# Patient Record
Sex: Female | Born: 1985 | Race: White | Hispanic: No | Marital: Married | Smoking: Never smoker
Health system: Southern US, Community
[De-identification: ages and names within clinical notes are randomized; demographics above are authoritative.]

## PROBLEM LIST (undated history)

## (undated) DIAGNOSIS — Z8619 Personal history of other infectious and parasitic diseases: Secondary | ICD-10-CM

## (undated) DIAGNOSIS — K219 Gastro-esophageal reflux disease without esophagitis: Secondary | ICD-10-CM

## (undated) HISTORY — PX: PLACEMENT OF BREAST IMPLANTS: SHX6334

## (undated) HISTORY — DX: Personal history of other infectious and parasitic diseases: Z86.19

---
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
## 2016-01-23 HISTORY — PX: TUBAL LIGATION: SHX77

## 2016-07-26 LAB — HM PAP SMEAR

## 2016-09-18 DIAGNOSIS — Z3400 Encounter for supervision of normal first pregnancy, unspecified trimester: Secondary | ICD-10-CM | POA: Diagnosis not present

## 2016-09-18 DIAGNOSIS — Z363 Encounter for antenatal screening for malformations: Secondary | ICD-10-CM | POA: Diagnosis not present

## 2016-09-18 DIAGNOSIS — Z3A17 17 weeks gestation of pregnancy: Secondary | ICD-10-CM | POA: Diagnosis not present

## 2016-09-18 DIAGNOSIS — Z3480 Encounter for supervision of other normal pregnancy, unspecified trimester: Secondary | ICD-10-CM | POA: Diagnosis not present

## 2016-10-10 DIAGNOSIS — Z3A21 21 weeks gestation of pregnancy: Secondary | ICD-10-CM | POA: Diagnosis not present

## 2016-10-16 DIAGNOSIS — Z3A21 21 weeks gestation of pregnancy: Secondary | ICD-10-CM | POA: Diagnosis not present

## 2016-10-16 DIAGNOSIS — O09212 Supervision of pregnancy with history of pre-term labor, second trimester: Secondary | ICD-10-CM | POA: Diagnosis not present

## 2016-10-16 DIAGNOSIS — Z3492 Encounter for supervision of normal pregnancy, unspecified, second trimester: Secondary | ICD-10-CM | POA: Diagnosis not present

## 2016-11-22 DIAGNOSIS — Z3A27 27 weeks gestation of pregnancy: Secondary | ICD-10-CM | POA: Diagnosis not present

## 2016-11-22 DIAGNOSIS — O360920 Maternal care for other rhesus isoimmunization, second trimester, not applicable or unspecified: Secondary | ICD-10-CM | POA: Diagnosis not present

## 2016-11-22 DIAGNOSIS — Z3492 Encounter for supervision of normal pregnancy, unspecified, second trimester: Secondary | ICD-10-CM | POA: Diagnosis not present

## 2016-11-22 DIAGNOSIS — Z23 Encounter for immunization: Secondary | ICD-10-CM | POA: Diagnosis not present

## 2016-11-22 DIAGNOSIS — Z3480 Encounter for supervision of other normal pregnancy, unspecified trimester: Secondary | ICD-10-CM | POA: Diagnosis not present

## 2016-12-04 DIAGNOSIS — Z3400 Encounter for supervision of normal first pregnancy, unspecified trimester: Secondary | ICD-10-CM | POA: Diagnosis not present

## 2016-12-04 DIAGNOSIS — D509 Iron deficiency anemia, unspecified: Secondary | ICD-10-CM | POA: Diagnosis not present

## 2016-12-11 DIAGNOSIS — Z3400 Encounter for supervision of normal first pregnancy, unspecified trimester: Secondary | ICD-10-CM | POA: Diagnosis not present

## 2016-12-11 DIAGNOSIS — Z3480 Encounter for supervision of other normal pregnancy, unspecified trimester: Secondary | ICD-10-CM | POA: Diagnosis not present

## 2016-12-17 DIAGNOSIS — O26873 Cervical shortening, third trimester: Secondary | ICD-10-CM | POA: Diagnosis not present

## 2016-12-17 DIAGNOSIS — Z3A30 30 weeks gestation of pregnancy: Secondary | ICD-10-CM | POA: Diagnosis not present

## 2016-12-17 DIAGNOSIS — Z3400 Encounter for supervision of normal first pregnancy, unspecified trimester: Secondary | ICD-10-CM | POA: Diagnosis not present

## 2016-12-28 ENCOUNTER — Inpatient Hospital Stay (HOSPITAL_COMMUNITY)
Admission: AD | Admit: 2016-12-28 | Discharge: 2017-01-01 | DRG: 783 | Disposition: A | Discharge status: Home / Self Care | Payer: 59 | Source: Ambulatory Visit | Attending: Obstetrics and Gynecology | Admitting: Obstetrics and Gynecology

## 2016-12-28 ENCOUNTER — Encounter (HOSPITAL_COMMUNITY): Payer: Self-pay | Admitting: *Deleted

## 2016-12-28 ENCOUNTER — Other Ambulatory Visit: Payer: Self-pay

## 2016-12-28 DIAGNOSIS — Z9882 Breast implant status: Secondary | ICD-10-CM

## 2016-12-28 DIAGNOSIS — K219 Gastro-esophageal reflux disease without esophagitis: Secondary | ICD-10-CM | POA: Diagnosis present

## 2016-12-28 DIAGNOSIS — O9962 Diseases of the digestive system complicating childbirth: Secondary | ICD-10-CM | POA: Diagnosis present

## 2016-12-28 DIAGNOSIS — Z302 Encounter for sterilization: Secondary | ICD-10-CM

## 2016-12-28 DIAGNOSIS — O34219 Maternal care for unspecified type scar from previous cesarean delivery: Secondary | ICD-10-CM | POA: Diagnosis not present

## 2016-12-28 DIAGNOSIS — O34211 Maternal care for low transverse scar from previous cesarean delivery: Principal | ICD-10-CM | POA: Diagnosis present

## 2016-12-28 DIAGNOSIS — Z3A32 32 weeks gestation of pregnancy: Secondary | ICD-10-CM | POA: Diagnosis not present

## 2016-12-28 DIAGNOSIS — O4703 False labor before 37 completed weeks of gestation, third trimester: Secondary | ICD-10-CM

## 2016-12-28 HISTORY — DX: Gastro-esophageal reflux disease without esophagitis: K21.9

## 2016-12-28 LAB — COMPREHENSIVE METABOLIC PANEL
ALBUMIN: 3.1 g/dL — AB (ref 3.5–5.0)
ALK PHOS: 146 U/L — AB (ref 38–126)
ALT: 10 U/L — AB (ref 14–54)
ANION GAP: 10 (ref 5–15)
AST: 22 U/L (ref 15–41)
BUN: 12 mg/dL (ref 6–20)
CALCIUM: 8.6 mg/dL — AB (ref 8.9–10.3)
CO2: 19 mmol/L — AB (ref 22–32)
Chloride: 105 mmol/L (ref 101–111)
Creatinine, Ser: 0.66 mg/dL (ref 0.44–1.00)
GFR calc Af Amer: >60 mL/min (ref >60)
GFR calc non Af Amer: >60 mL/min (ref >60)
GLUCOSE: 108 mg/dL — AB (ref 65–99)
Potassium: 4.1 mmol/L (ref 3.5–5.1)
SODIUM: 134 mmol/L — AB (ref 135–145)
Total Bilirubin: 0.5 mg/dL (ref 0.3–1.2)
Total Protein: 6.3 g/dL — ABNORMAL LOW (ref 6.5–8.1)

## 2016-12-28 LAB — CBC
HCT: 32.6 % — ABNORMAL LOW (ref 36.0–46.0)
HEMOGLOBIN: 10.6 g/dL — AB (ref 12.0–15.0)
MCH: 29.5 pg (ref 26.0–34.0)
MCHC: 32.5 g/dL (ref 30.0–36.0)
MCV: 90.8 fL (ref 78.0–100.0)
Platelets: 236 10*3/uL (ref 150–400)
RBC: 3.59 MIL/uL — ABNORMAL LOW (ref 3.87–5.11)
RDW: 12.2 % (ref 11.5–15.5)
WBC: 9.7 10*3/uL (ref 4.0–10.5)

## 2016-12-28 LAB — URINALYSIS, ROUTINE W REFLEX MICROSCOPIC
Bilirubin Urine: NEGATIVE
Glucose, UA: NEGATIVE mg/dL
Hgb urine dipstick: NEGATIVE
Ketones, ur: NEGATIVE mg/dL
Nitrite: NEGATIVE
PROTEIN: NEGATIVE mg/dL
SPECIFIC GRAVITY, URINE: 1.016 (ref 1.005–1.030)
pH: 6 (ref 5.0–8.0)

## 2016-12-28 LAB — WET PREP, GENITAL
Sperm: NONE SEEN
Trich, Wet Prep: NONE SEEN
YEAST WET PREP: NONE SEEN

## 2016-12-28 MED ORDER — LACTATED RINGERS IV SOLN
INTRAVENOUS | Status: DC
  Administered 2016-12-28 – 2016-12-29 (×2): via INTRAVENOUS

## 2016-12-28 MED ORDER — CALCIUM CARBONATE ANTACID 500 MG PO CHEW
2.0000 | CHEWABLE_TABLET | ORAL | Status: DC | PRN
Start: 2016-12-28 — End: 2016-12-29

## 2016-12-28 MED ORDER — MAGNESIUM SULFATE BOLUS VIA INFUSION
4.0000 g | Freq: Once | INTRAVENOUS | Status: AC
  Administered 2016-12-28: 4 g via INTRAVENOUS
  Filled 2016-12-28: qty 500

## 2016-12-28 MED ORDER — BETAMETHASONE SOD PHOS & ACET 6 (3-3) MG/ML IJ SUSP
12.0000 mg | INTRAMUSCULAR | Status: DC
  Administered 2016-12-28: 12 mg via INTRAMUSCULAR
  Filled 2016-12-28: qty 2

## 2016-12-28 MED ORDER — DOCUSATE SODIUM 100 MG PO CAPS
100.0000 mg | ORAL_CAPSULE | Freq: Every day | ORAL | Status: DC
  Administered 2016-12-28: 100 mg via ORAL
  Filled 2016-12-28: qty 1

## 2016-12-28 MED ORDER — ACETAMINOPHEN 325 MG PO TABS: 650.0000 mg | ORAL_TABLET | ORAL | Status: DC | PRN

## 2016-12-28 MED ORDER — PRENATAL MULTIVITAMIN CH: 1.0000 | ORAL_TABLET | Freq: Every day | ORAL | Status: DC

## 2016-12-28 MED ORDER — LACTATED RINGERS IV BOLUS (SEPSIS)
1000.0000 mL | Freq: Once | INTRAVENOUS | Status: AC
  Administered 2016-12-28: 1000 mL via INTRAVENOUS

## 2016-12-28 MED ORDER — ZOLPIDEM TARTRATE 5 MG PO TABS: 5.0000 mg | ORAL_TABLET | Freq: Every evening | ORAL | Status: DC | PRN

## 2016-12-28 MED ORDER — MAGNESIUM SULFATE 40 G IN LACTATED RINGERS - SIMPLE
3.0000 g/h | INTRAVENOUS | Status: DC
  Administered 2016-12-28: 2 g/h via INTRAVENOUS
  Filled 2016-12-28: qty 40

## 2016-12-28 NOTE — MAU Note (Signed)
Pt presents with c/o lower abdominal cramping & pressure since 1300 this afternnoon.  Denies VB or LOF, reports increased vaginal discharge, no odor.  Reports +FM. Reports 2 previous PT deliveries. Previous C/S x1, planning repeat.

## 2016-12-28 NOTE — MAU Provider Note (Signed)
History     CSN: [id number]  Arrival date and time: 12/28/16 1523   First Provider Initiated Contact with Patient 12/28/16 1554      Chief Complaint  Patient presents with  . Contractions   [id number] @32 .2 wks here with lower abdominal cramping and pelvic pressure. Sx started about 3 hrs ago. She denies VB or LOF. She reports increased yellow vaginal discharge today. Good FM. No urinanry sx. Her pregnancy is complicated by previous PTB x2 and CS x1. She is planning RCS and BTL. She was using 17-P injections but stopped due to cost.     OB History    Gravida Para Term Preterm AB Living   4 2   2 1 2    SAB TAB Ectopic Multiple Live Births   1       2      Past Medical History:  Diagnosis Date  . GERD (gastroesophageal reflux disease)     Past Surgical History:  Procedure Laterality Date  . CESAREAN SECTION    . PLACEMENT OF BREAST IMPLANTS      No family history on file.  Social History   Tobacco Use  . Smoking status: Never Smoker  . Smokeless tobacco: Never Used  Substance Use Topics  . Alcohol use: No    Frequency: Never    Comment: not during pregnancy  . Drug use: No    Allergies: Allergies not on file  No medications prior to admission.    Review of Systems  Gastrointestinal: Positive for abdominal pain.  Genitourinary: Positive for pelvic pain and vaginal discharge. Negative for vaginal bleeding.   Physical Exam   Blood pressure 126/85, pulse 94, temperature 98.1 F (36.7 C), temperature source Oral, height 5\' 3"  (1.6 m), weight 141 lb 8 oz (64.2 kg), SpO2 98 %.  Physical Exam  Constitutional: She is oriented to person, place, and time. She appears well-developed and well-nourished. No distress.  HENT:  Head: Normocephalic and atraumatic.  Neck: Normal range of motion.  Respiratory: Effort normal. No respiratory distress.  GI: Soft. She exhibits no distension. There is no tenderness.  gravid  Genitourinary:  Genitourinary Comments: SVE  1.5/60/-2, vtx ballotable  Musculoskeletal: Normal range of motion.  Neurological: She is alert and oriented to person, place, and time.  Skin: Skin is warm and dry.  Psychiatric: She has a normal mood and affect.  EFM: 140 bpm, mod variability, + accels, no decels Toco: 3-5 w/irritability  Results for orders placed or performed during the hospital encounter of 12/28/16 (from the past 24 hour(s))  Urinalysis, Routine w reflex microscopic     Status: Abnormal   Collection Time: 12/28/16  3:40 PM  Result Value Ref Range   Color, Urine YELLOW YELLOW   APPearance HAZY (A) CLEAR   Specific Gravity, Urine 1.016 1.005 - 1.030   pH 6.0 5.0 - 8.0   Glucose, UA NEGATIVE NEGATIVE mg/dL   Hgb urine dipstick NEGATIVE NEGATIVE   Bilirubin Urine NEGATIVE NEGATIVE   Ketones, ur NEGATIVE NEGATIVE mg/dL   Protein, ur NEGATIVE NEGATIVE mg/dL   Nitrite NEGATIVE NEGATIVE   Leukocytes, UA LARGE (A) NEGATIVE   RBC / HPF 0-5 0 - 5 RBC/hpf   WBC, UA 6-30 0 - 5 WBC/hpf   Bacteria, UA FEW (A) NONE SEEN   Squamous Epithelial / LPF 6-30 (A) NONE SEEN   Mucus PRESENT    MAU Course  Procedures Magnesium Sulfate 4/2 Betamethasone LR 1 L  MDM Labs ordered and reviewed. Dr.  Tomblin notified of pt presentation and clinical findings. Will start Mg and admit.  Assessment and Plan  32.2 weeks Threatened PTL Admit Management per MD  Julianne Handler, CNM 12/28/2016, 4:14 PM

## 2016-12-28 NOTE — H&P (Signed)
Alyssa [name] is an 31 y.o. female with contractions getting progressively heavier through the day. No ROM, no bleeding, no fever/chills, no N/V. History of two premature deliveries and desires repeat cesarean section with BTL for this delivery.  Pertinent Gynecological History: Menses: pregnant Bleeding: N/A Contraception: none DES exposure: denies Blood transfusions: none Sexually transmitted diseases: no past history Previous GYN Procedures: none  Last mammogram: N/A Date: N/A Last pap: normal Date: 2018 OB History: G4, P2   Menstrual History: Menarche age: unknown No LMP recorded. Patient is pregnant.    Past Medical History:  Diagnosis Date  . GERD (gastroesophageal reflux disease)     Past Surgical History:  Procedure Laterality Date  . CESAREAN SECTION    . PLACEMENT OF BREAST IMPLANTS      No family history on file.  Social History:  reports that  has never smoked. she has never used smokeless tobacco. She reports that she does not drink alcohol or use drugs.  Allergies: No Known Allergies  Medications Prior to Admission  Medication Sig Dispense Refill Last Dose  . calcium carbonate (TUMS - DOSED IN MG ELEMENTAL CALCIUM) 500 MG chewable tablet Chew 1-2 tablets by mouth daily as needed for indigestion or heartburn.   12/27/2016 at Unknown time  . IRON PO Take 1 tablet by mouth at bedtime.   12/27/2016 at Unknown time  . Prenatal Vit-Fe Fumarate-FA (PRENATAL MULTIVITAMIN) TABS tablet Take 1 tablet by mouth daily at 12 noon.   12/27/2016 at Unknown time    Review of Systems  Constitutional: Negative for fever.  Eyes: Negative for blurred vision.  Neurological: Negative for headaches.    Blood pressure 124/63, pulse 84, temperature 98.1 F (36.7 C), temperature source Oral, resp. rate 18, height 5\' 3"  (1.6 m), weight 141 lb 8 oz (64.2 kg), SpO2 98 %. Physical Exam  Cardiovascular: Normal rate and regular rhythm.  Respiratory: Effort normal and breath sounds normal.   GI: Soft. There is no tenderness.  Neurological: She has normal reflexes.   FHT cat one UCs were q2-5, now spacing out Cx 1.5/60/vtx per nurse check  Results for orders placed or performed during the hospital encounter of 12/28/16 (from the past 24 hour(s))  Urinalysis, Routine w reflex microscopic     Status: Abnormal   Collection Time: 12/28/16  3:40 PM  Result Value Ref Range   Color, Urine YELLOW YELLOW   APPearance HAZY (A) CLEAR   Specific Gravity, Urine 1.016 1.005 - 1.030   pH 6.0 5.0 - 8.0   Glucose, UA NEGATIVE NEGATIVE mg/dL   Hgb urine dipstick NEGATIVE NEGATIVE   Bilirubin Urine NEGATIVE NEGATIVE   Ketones, ur NEGATIVE NEGATIVE mg/dL   Protein, ur NEGATIVE NEGATIVE mg/dL   Nitrite NEGATIVE NEGATIVE   Leukocytes, UA LARGE (A) NEGATIVE   RBC / HPF 0-5 0 - 5 RBC/hpf   WBC, UA 6-30 0 - 5 WBC/hpf   Bacteria, UA FEW (A) NONE SEEN   Squamous Epithelial / LPF 6-30 (A) NONE SEEN   Mucus PRESENT   Wet prep, genital     Status: Abnormal   Collection Time: 12/28/16  4:15 PM  Result Value Ref Range   Yeast Wet Prep HPF POC NONE SEEN NONE SEEN   Trich, Wet Prep NONE SEEN NONE SEEN   Clue Cells Wet Prep HPF POC PRESENT (A) NONE SEEN   WBC, Wet Prep HPF POC MANY (A) NONE SEEN   Sperm NONE SEEN     No results found.  Assessment/Plan:  31 yo G4P2 @ 32 2/7 weeks in PTL IV fluids Magnesium sulfate for tocolysis and CP prophylaxis Betamethasone ordered U/S in am C/S with BTL for delivery D/W patient and husband  Shon Millet II 12/28/2016, 4:59 PM

## 2016-12-29 ENCOUNTER — Encounter (HOSPITAL_COMMUNITY): Payer: Self-pay | Admitting: Anesthesiology

## 2016-12-29 ENCOUNTER — Inpatient Hospital Stay (HOSPITAL_COMMUNITY): Payer: 59 | Admitting: Anesthesiology

## 2016-12-29 ENCOUNTER — Encounter (HOSPITAL_COMMUNITY)
Admission: AD | Disposition: A | Discharge status: Home / Self Care | Payer: Self-pay | Source: Ambulatory Visit | Attending: Obstetrics and Gynecology

## 2016-12-29 LAB — CBC
HCT: 29.4 % — ABNORMAL LOW (ref 36.0–46.0)
Hemoglobin: 9.9 g/dL — ABNORMAL LOW (ref 12.0–15.0)
MCH: 30.6 pg (ref 26.0–34.0)
MCHC: 33.7 g/dL (ref 30.0–36.0)
MCV: 90.7 fL (ref 78.0–100.0)
PLATELETS: 190 10*3/uL (ref 150–400)
RBC: 3.24 MIL/uL — ABNORMAL LOW (ref 3.87–5.11)
RDW: 12.2 % (ref 11.5–15.5)
WBC: 17.5 10*3/uL — AB (ref 4.0–10.5)

## 2016-12-29 SURGERY — Surgical Case
Anesthesia: Spinal

## 2016-12-29 MED ORDER — DIPHENHYDRAMINE HCL 25 MG PO CAPS: 25.0000 mg | ORAL_CAPSULE | ORAL | Status: DC | PRN

## 2016-12-29 MED ORDER — NALBUPHINE HCL 10 MG/ML IJ SOLN: 5.0000 mg | INTRAMUSCULAR | Status: DC | PRN

## 2016-12-29 MED ORDER — SODIUM CHLORIDE 0.9% FLUSH
INTRAVENOUS | Status: AC
  Filled 2016-12-29: qty 9

## 2016-12-29 MED ORDER — WITCH HAZEL-GLYCERIN EX PADS: 1.0000 "application " | MEDICATED_PAD | CUTANEOUS | Status: DC | PRN

## 2016-12-29 MED ORDER — SODIUM CHLORIDE 0.9% FLUSH
INTRAVENOUS | Status: AC
  Filled 2016-12-29: qty 3

## 2016-12-29 MED ORDER — OXYTOCIN 10 UNIT/ML IJ SOLN
INTRAVENOUS | Status: DC | PRN
  Administered 2016-12-29: 40 [IU] via INTRAVENOUS

## 2016-12-29 MED ORDER — KETOROLAC TROMETHAMINE 30 MG/ML IJ SOLN
30.0000 mg | Freq: Four times a day (QID) | INTRAMUSCULAR | Status: AC | PRN
  Administered 2016-12-29: 30 mg via INTRAMUSCULAR

## 2016-12-29 MED ORDER — MEPERIDINE HCL 25 MG/ML IJ SOLN: 6.2500 mg | INTRAMUSCULAR | Status: DC | PRN

## 2016-12-29 MED ORDER — PROMETHAZINE HCL 25 MG/ML IJ SOLN
INTRAMUSCULAR | Status: AC
  Filled 2016-12-29: qty 1

## 2016-12-29 MED ORDER — DIBUCAINE 1 % RE OINT: 1.0000 "application " | TOPICAL_OINTMENT | RECTAL | Status: DC | PRN

## 2016-12-29 MED ORDER — LACTATED RINGERS IV SOLN: INTRAVENOUS | Status: DC

## 2016-12-29 MED ORDER — NALOXONE HCL 0.4 MG/ML IJ SOLN: 0.4000 mg | INTRAMUSCULAR | Status: DC | PRN

## 2016-12-29 MED ORDER — ACETAMINOPHEN 500 MG PO TABS
ORAL_TABLET | ORAL | Status: AC
  Filled 2016-12-29: qty 2

## 2016-12-29 MED ORDER — SCOPOLAMINE 1 MG/3DAYS TD PT72
MEDICATED_PATCH | TRANSDERMAL | Status: DC | PRN
  Administered 2016-12-29: 1 via TRANSDERMAL

## 2016-12-29 MED ORDER — LACTATED RINGERS IV SOLN
INTRAVENOUS | Status: DC | PRN
  Administered 2016-12-29: 03:00:00 via INTRAVENOUS

## 2016-12-29 MED ORDER — COCONUT OIL OIL
1.0000 "application " | TOPICAL_OIL | Status: DC | PRN
  Administered 2016-12-29: 1 via TOPICAL
  Filled 2016-12-29: qty 120

## 2016-12-29 MED ORDER — ZOLPIDEM TARTRATE 5 MG PO TABS: 5.0000 mg | ORAL_TABLET | Freq: Every evening | ORAL | Status: DC | PRN

## 2016-12-29 MED ORDER — FENTANYL CITRATE (PF) 100 MCG/2ML IJ SOLN
INTRAMUSCULAR | Status: DC | PRN
  Administered 2016-12-29: 20 ug via INTRATHECAL

## 2016-12-29 MED ORDER — BUPIVACAINE IN DEXTROSE 0.75-8.25 % IT SOLN
INTRATHECAL | Status: DC | PRN
  Administered 2016-12-29: 1.2 mL via INTRATHECAL

## 2016-12-29 MED ORDER — SODIUM CHLORIDE 0.9 % IR SOLN
Status: DC | PRN
Start: 2016-12-29 — End: 2016-12-29
  Administered 2016-12-29: 500 mL

## 2016-12-29 MED ORDER — DIPHENHYDRAMINE HCL 25 MG PO CAPS: 25.0000 mg | ORAL_CAPSULE | Freq: Four times a day (QID) | ORAL | Status: DC | PRN

## 2016-12-29 MED ORDER — KETOROLAC TROMETHAMINE 30 MG/ML IJ SOLN: 30.0000 mg | Freq: Once | INTRAMUSCULAR | Status: DC | PRN

## 2016-12-29 MED ORDER — SIMETHICONE 80 MG PO CHEW
80.0000 mg | CHEWABLE_TABLET | Freq: Three times a day (TID) | ORAL | Status: DC
  Administered 2016-12-29 – 2017-01-01 (×9): 80 mg via ORAL
  Filled 2016-12-29 (×9): qty 1

## 2016-12-29 MED ORDER — DEXAMETHASONE SODIUM PHOSPHATE 10 MG/ML IJ SOLN
INTRAMUSCULAR | Status: DC | PRN
  Administered 2016-12-29: 10 mg via INTRAVENOUS

## 2016-12-29 MED ORDER — OXYCODONE HCL 5 MG PO TABS
10.0000 mg | ORAL_TABLET | ORAL | Status: DC | PRN
  Administered 2017-01-01: 10 mg via ORAL
  Filled 2016-12-29 (×2): qty 2

## 2016-12-29 MED ORDER — OXYTOCIN 40 UNITS IN LACTATED RINGERS INFUSION - SIMPLE MED: 2.5000 [IU]/h | INTRAVENOUS | Status: AC

## 2016-12-29 MED ORDER — BUPIVACAINE IN DEXTROSE 0.75-8.25 % IT SOLN
INTRATHECAL | Status: AC
  Filled 2016-12-29: qty 2

## 2016-12-29 MED ORDER — ONDANSETRON HCL 4 MG/2ML IJ SOLN
INTRAMUSCULAR | Status: DC | PRN
  Administered 2016-12-29: 4 mg via INTRAVENOUS

## 2016-12-29 MED ORDER — PROMETHAZINE HCL 25 MG/ML IJ SOLN
6.2500 mg | INTRAMUSCULAR | Status: DC | PRN
  Administered 2016-12-29: 6.25 mg via INTRAVENOUS

## 2016-12-29 MED ORDER — SIMETHICONE 80 MG PO CHEW
80.0000 mg | CHEWABLE_TABLET | ORAL | Status: DC
  Administered 2016-12-29 – 2016-12-31 (×3): 80 mg via ORAL
  Filled 2016-12-29 (×3): qty 1

## 2016-12-29 MED ORDER — NALBUPHINE HCL 10 MG/ML IJ SOLN: 5.0000 mg | Freq: Once | INTRAMUSCULAR | Status: DC | PRN

## 2016-12-29 MED ORDER — DEXAMETHASONE SODIUM PHOSPHATE 10 MG/ML IJ SOLN
INTRAMUSCULAR | Status: AC
  Filled 2016-12-29: qty 1

## 2016-12-29 MED ORDER — PRENATAL MULTIVITAMIN CH
1.0000 | ORAL_TABLET | Freq: Every day | ORAL | Status: DC
  Administered 2016-12-29 – 2016-12-31 (×3): 1 via ORAL
  Filled 2016-12-29 (×3): qty 1

## 2016-12-29 MED ORDER — CEFAZOLIN SODIUM-DEXTROSE 2-3 GM-%(50ML) IV SOLR
INTRAVENOUS | Status: AC
  Filled 2016-12-29: qty 50

## 2016-12-29 MED ORDER — MORPHINE SULFATE (PF) 0.5 MG/ML IJ SOLN
INTRAMUSCULAR | Status: DC | PRN
  Administered 2016-12-29: .2 mg via INTRATHECAL

## 2016-12-29 MED ORDER — PROMETHAZINE HCL 25 MG/ML IJ SOLN
INTRAMUSCULAR | Status: AC
  Administered 2016-12-29: 6.25 mg via INTRAVENOUS
  Filled 2016-12-29: qty 1

## 2016-12-29 MED ORDER — KETOROLAC TROMETHAMINE 30 MG/ML IJ SOLN
INTRAMUSCULAR | Status: AC
  Administered 2016-12-29: 30 mg via INTRAMUSCULAR
  Filled 2016-12-29: qty 1

## 2016-12-29 MED ORDER — ONDANSETRON HCL 4 MG/2ML IJ SOLN: 4.0000 mg | Freq: Three times a day (TID) | INTRAMUSCULAR | Status: DC | PRN

## 2016-12-29 MED ORDER — SODIUM CHLORIDE 0.9% FLUSH: 3.0000 mL | INTRAVENOUS | Status: DC | PRN

## 2016-12-29 MED ORDER — SIMETHICONE 80 MG PO CHEW
80.0000 mg | CHEWABLE_TABLET | ORAL | Status: DC | PRN
Start: 2016-12-29 — End: 2017-01-01

## 2016-12-29 MED ORDER — SENNOSIDES-DOCUSATE SODIUM 8.6-50 MG PO TABS
2.0000 | ORAL_TABLET | ORAL | Status: DC
  Administered 2016-12-29 – 2016-12-31 (×3): 2 via ORAL
  Filled 2016-12-29 (×3): qty 2

## 2016-12-29 MED ORDER — CEFAZOLIN SODIUM-DEXTROSE 2-3 GM-%(50ML) IV SOLR
INTRAVENOUS | Status: DC | PRN
  Administered 2016-12-29: 2 g via INTRAVENOUS

## 2016-12-29 MED ORDER — HYDROMORPHONE HCL 1 MG/ML IJ SOLN: 0.2500 mg | INTRAMUSCULAR | Status: DC | PRN

## 2016-12-29 MED ORDER — PHENYLEPHRINE 8 MG IN D5W 100 ML (0.08MG/ML) PREMIX OPTIME
INJECTION | INTRAVENOUS | Status: AC
  Filled 2016-12-29: qty 100

## 2016-12-29 MED ORDER — ACETAMINOPHEN 325 MG PO TABS: 650.0000 mg | ORAL_TABLET | ORAL | Status: DC | PRN

## 2016-12-29 MED ORDER — ACETAMINOPHEN 500 MG PO TABS
1000.0000 mg | ORAL_TABLET | Freq: Four times a day (QID) | ORAL | Status: AC
  Administered 2016-12-29 (×3): 1000 mg via ORAL
  Filled 2016-12-29 (×3): qty 2

## 2016-12-29 MED ORDER — SCOPOLAMINE 1 MG/3DAYS TD PT72
MEDICATED_PATCH | TRANSDERMAL | Status: AC
  Filled 2016-12-29: qty 1

## 2016-12-29 MED ORDER — OXYTOCIN 10 UNIT/ML IJ SOLN
INTRAMUSCULAR | Status: AC
  Filled 2016-12-29: qty 4

## 2016-12-29 MED ORDER — KETOROLAC TROMETHAMINE 30 MG/ML IJ SOLN: 30.0000 mg | Freq: Four times a day (QID) | INTRAMUSCULAR | Status: AC | PRN

## 2016-12-29 MED ORDER — DIPHENHYDRAMINE HCL 50 MG/ML IJ SOLN: 12.5000 mg | INTRAMUSCULAR | Status: DC | PRN

## 2016-12-29 MED ORDER — NALBUPHINE HCL 10 MG/ML IJ SOLN
5.0000 mg | INTRAMUSCULAR | Status: DC | PRN
  Administered 2016-12-29: 5 mg via INTRAVENOUS
  Filled 2016-12-29: qty 1

## 2016-12-29 MED ORDER — TETANUS-DIPHTH-ACELL PERTUSSIS 5-2.5-18.5 LF-MCG/0.5 IM SUSP: 0.5000 mL | Freq: Once | INTRAMUSCULAR | Status: DC

## 2016-12-29 MED ORDER — PHENYLEPHRINE 8 MG IN D5W 100 ML (0.08MG/ML) PREMIX OPTIME
INJECTION | INTRAVENOUS | Status: DC | PRN
  Administered 2016-12-29: 60 ug/min via INTRAVENOUS

## 2016-12-29 MED ORDER — MORPHINE SULFATE (PF) 0.5 MG/ML IJ SOLN
INTRAMUSCULAR | Status: AC
  Filled 2016-12-29: qty 10

## 2016-12-29 MED ORDER — SODIUM CHLORIDE 0.9 % IR SOLN
Status: DC | PRN
Start: 2016-12-29 — End: 2016-12-29
  Administered 2016-12-29: 1000 mL

## 2016-12-29 MED ORDER — FENTANYL CITRATE (PF) 100 MCG/2ML IJ SOLN
INTRAMUSCULAR | Status: AC
  Filled 2016-12-29: qty 2

## 2016-12-29 MED ORDER — ONDANSETRON HCL 4 MG/2ML IJ SOLN
INTRAMUSCULAR | Status: AC
  Filled 2016-12-29: qty 2

## 2016-12-29 MED ORDER — MENTHOL 3 MG MT LOZG: 1.0000 | LOZENGE | OROMUCOSAL | Status: DC | PRN

## 2016-12-29 MED ORDER — SOD CITRATE-CITRIC ACID 500-334 MG/5ML PO SOLN
ORAL | Status: AC
  Administered 2016-12-29: 30 mL
  Filled 2016-12-29: qty 15

## 2016-12-29 MED ORDER — IBUPROFEN 600 MG PO TABS
600.0000 mg | ORAL_TABLET | Freq: Four times a day (QID) | ORAL | Status: DC
  Administered 2016-12-29 – 2017-01-01 (×11): 600 mg via ORAL
  Filled 2016-12-29 (×11): qty 1

## 2016-12-29 MED ORDER — NALOXONE HCL 0.4 MG/ML IJ SOLN: 1.0000 ug/kg/h | INTRAVENOUS | Status: DC | PRN

## 2016-12-29 MED ORDER — SCOPOLAMINE 1 MG/3DAYS TD PT72: 1.0000 | MEDICATED_PATCH | Freq: Once | TRANSDERMAL | Status: DC

## 2016-12-29 MED ORDER — OXYCODONE HCL 5 MG PO TABS
5.0000 mg | ORAL_TABLET | ORAL | Status: DC | PRN
  Administered 2016-12-30 – 2016-12-31 (×5): 5 mg via ORAL
  Filled 2016-12-29 (×4): qty 1

## 2016-12-29 SURGICAL SUPPLY — 39 items
BENZOIN TINCTURE PRP APPL 2/3 (GAUZE/BANDAGES/DRESSINGS) ×3 IMPLANT
CLAMP CORD UMBIL (MISCELLANEOUS) IMPLANT
CLOSURE STERI STRIP 1/2 X4 (GAUZE/BANDAGES/DRESSINGS) ×3 IMPLANT
CLOSURE WOUND 1/2 X4 (GAUZE/BANDAGES/DRESSINGS)
CLOTH BEACON ORANGE TIMEOUT ST (SAFETY) ×3 IMPLANT
DERMABOND ADVANCED (GAUZE/BANDAGES/DRESSINGS)
DERMABOND ADVANCED .7 DNX12 (GAUZE/BANDAGES/DRESSINGS) IMPLANT
DRSG OPSITE POSTOP 4X10 (GAUZE/BANDAGES/DRESSINGS) ×3 IMPLANT
DURAPREP 26ML APPLICATOR (WOUND CARE) ×3 IMPLANT
ELECT REM PT RETURN 9FT ADLT (ELECTROSURGICAL) ×3
ELECTRODE REM PT RTRN 9FT ADLT (ELECTROSURGICAL) ×1 IMPLANT
EXTRACTOR VACUUM KIWI (MISCELLANEOUS) IMPLANT
GLOVE BIO SURGEON STRL SZ 6 (GLOVE) ×3 IMPLANT
GLOVE BIOGEL PI IND STRL 6 (GLOVE) ×2 IMPLANT
GLOVE BIOGEL PI IND STRL 7.0 (GLOVE) ×1 IMPLANT
GLOVE BIOGEL PI INDICATOR 6 (GLOVE) ×4
GLOVE BIOGEL PI INDICATOR 7.0 (GLOVE) ×2
GOWN STRL REUS W/TWL LRG LVL3 (GOWN DISPOSABLE) ×6 IMPLANT
KIT ABG SYR 3ML LUER SLIP (SYRINGE) ×3 IMPLANT
NEEDLE HYPO 25X5/8 SAFETYGLIDE (NEEDLE) ×3 IMPLANT
NS IRRIG 1000ML POUR BTL (IV SOLUTION) ×3 IMPLANT
PACK C SECTION WH (CUSTOM PROCEDURE TRAY) ×3 IMPLANT
PAD ABD 8X7 1/2 STERILE (GAUZE/BANDAGES/DRESSINGS) ×3 IMPLANT
PAD OB MATERNITY 4.3X12.25 (PERSONAL CARE ITEMS) ×3 IMPLANT
PENCIL SMOKE EVAC W/HOLSTER (ELECTROSURGICAL) ×3 IMPLANT
SPONGE GAUZE 4X4 12PLY STER LF (GAUZE/BANDAGES/DRESSINGS) ×3 IMPLANT
STRIP CLOSURE SKIN 1/2X4 (GAUZE/BANDAGES/DRESSINGS) IMPLANT
SUT CHROMIC 0 CTX 36 (SUTURE) ×9 IMPLANT
SUT GUT PLAIN 0 CT-3 TAN 27 (SUTURE) ×3 IMPLANT
SUT MON AB 2-0 CT1 27 (SUTURE) ×3 IMPLANT
SUT PDS AB 0 CT1 27 (SUTURE) IMPLANT
SUT PLAIN 0 NONE (SUTURE) IMPLANT
SUT PLAIN 2 0 (SUTURE) ×2
SUT PLAIN ABS 2-0 54XMFL TIE (SUTURE) ×1 IMPLANT
SUT VIC AB 0 CT1 36 (SUTURE) IMPLANT
SUT VIC AB 4-0 KS 27 (SUTURE) IMPLANT
TAPE CLOTH SURG 4X10 WHT LF (GAUZE/BANDAGES/DRESSINGS) ×3 IMPLANT
TOWEL OR 17X24 6PK STRL BLUE (TOWEL DISPOSABLE) ×3 IMPLANT
TRAY FOLEY BAG SILVER LF 14FR (SET/KITS/TRAYS/PACK) IMPLANT

## 2016-12-29 NOTE — Brief Op Note (Signed)
12/28/2016 - 12/29/2016  3:08 AM  PATIENT:  Alyssa [name]  31 y.o. female  PRE-OPERATIVE DIAGNOSIS:  REPEAT CS;DESIRE STERILIZATION  POST-OPERATIVE DIAGNOSIS:  CESAREAN SECTION WITH BILATERAL TUBAL LIGATION  PROCEDURE:  Procedure(s): REPEAT CESAREAN SECTION WITH BILATERAL TUBAL LIGATION (N/A)  SURGEON:  Surgeon(s) and Role:    * Morris, Barista, DO - Primary  PHYSICIAN ASSISTANT:   ASSISTANTS: none   ANESTHESIA:   spinal  EBL:  500 mL   BLOOD ADMINISTERED:none  DRAINS: Urinary Catheter (Foley)   LOCAL MEDICATIONS USED:  NONE  SPECIMEN:  Source of Specimen:  palcenta, bilateral follopian tube segments  DISPOSITION OF SPECIMEN:  PATHOLOGY  COUNTS:  YES  TOURNIQUET:  * No tourniquets in log *  DICTATION: .Other Dictation: Dictation Number [id number]  PLAN OF CARE: Admit to inpatient   PATIENT DISPOSITION:  PACU - hemodynamically stable.   Delay start of Pharmacological VTE agent (>24hrs) due to surgical blood loss or risk of bleeding: not applicable

## 2016-12-29 NOTE — Progress Notes (Signed)
Subjective: Postpartum Day 0: Cesarean Delivery Patient reports tolerating sips.    Objective: Vital signs in last 24 hours: Temp:  [97.4 F (36.3 C)-98.5 F (36.9 C)] 98.3 F (36.8 C) (12/08 0811) Pulse Rate:  [71-98] 72 (12/08 0811) Resp:  [14-18] 16 (12/08 0811) BP: (93-126)/(44-85) 99/50 (12/08 0811) SpO2:  [94 %-100 %] 98 % (12/08 0811) Weight:  [141 lb 8 oz (64.2 kg)] 141 lb 8 oz (64.2 kg) (12/07 1533)  Physical Exam:  General: alert, cooperative and no distress Lochia: appropriate Uterine Fundus: firm Incision: healing well DVT Evaluation: No evidence of DVT seen on physical exam. Abdomen flat, soft, good BS Dressing C&D PAS on Recent Labs    12/28/16 1615 12/29/16 0632  HGB 10.6* 9.9*  HCT 32.6* 29.4*    Assessment/Plan: Status post Cesarean section. Doing well postoperatively.  Continue current care.  Shon Millet II 12/29/2016, 9:25 AM

## 2016-12-29 NOTE — Lactation Note (Signed)
This note was copied from a baby's chart. Lactation Consultation Note; Initial visit with this NICU mom Baby now 67 hours old. Assisted mom with pumping. Reviewed setup, use and cleaning of pump pieces. Mom has had 2 previous preterm babies and pumped for them. Reports she did not make much milk with them. States she will change to formula if she does not make much milk with this as it is a lot of work to pump. . Does plan to try to get some breast milk for this baby. Encouragement given. NICU booklet given- reviewed pumping log with mom. Encouraged pumping 8 times/day with hand expression after pumping. No questions at present. To call for assist prn. Mom continues pumping as I left room.   Patient Name: Alyssa [name] Today's Date: 12/29/2016 Reason for consult: Initial assessment;NICU baby;Preterm <34wks   Maternal Data Formula Feeding for Exclusion: No Has patient been taught Hand Expression?: Yes Does the patient have breastfeeding experience prior to this delivery?: Yes  Feeding    LATCH Score                   Interventions Interventions: Hand express  Lactation Tools Discussed/Used WIC Program: No Pump Review: Setup, frequency, and cleaning Initiated by:: DW Date initiated:: 12/29/16   Consult Status Consult Status: Follow-up Date: 12/30/16 Follow-up type: In-patient    Truddie Crumble 12/29/2016, 8:32 AM

## 2016-12-29 NOTE — Anesthesia Preprocedure Evaluation (Addendum)
Anesthesia Evaluation  Patient identified by MRN, date of birth, ID band Patient awake    Reviewed: Allergy & Precautions, H&P , NPO status , Patient's Chart, lab work & pertinent test results  Airway Mallampati: I  TM Distance: >3 FB Neck ROM: full    Dental no notable dental hx. (+) Teeth Intact   Pulmonary neg pulmonary ROS,    Pulmonary exam normal breath sounds clear to auscultation       Cardiovascular negative cardio ROS Normal cardiovascular exam Rhythm:regular Rate:Normal     Neuro/Psych negative neurological ROS  negative psych ROS   GI/Hepatic Neg liver ROS,   Endo/Other  negative endocrine ROS  Renal/GU negative Renal ROS  negative genitourinary   Musculoskeletal negative musculoskeletal ROS (+)   Abdominal Normal abdominal exam  (+)   Peds  Hematology negative hematology ROS (+)   Anesthesia Other Findings   Reproductive/Obstetrics (+) Pregnancy                             Anesthesia Physical Anesthesia Plan  ASA: II  Anesthesia Plan: Spinal   Post-op Pain Management:    Induction:   PONV Risk Score and Plan: 3 and Scopolamine patch - Pre-op, Dexamethasone and Ondansetron  Airway Management Planned: Natural Airway and Nasal Cannula  Additional Equipment:   Intra-op Plan:   Post-operative Plan:   Informed Consent: I have reviewed the patients History and Physical, chart, labs and discussed the procedure including the risks, benefits and alternatives for the proposed anesthesia with the patient or authorized representative who has indicated his/her understanding and acceptance.     Plan Discussed with: CRNA and Surgeon  Anesthesia Plan Comments:        Anesthesia Quick Evaluation

## 2016-12-29 NOTE — Op Note (Signed)
NAMEJAZSMIN, COUSE                 ACCOUNT NO.:  [id number]  MEDICAL RECORD NO.:  [id number]  LOCATION:  9311                          FACILITY:  [facility]  PHYSICIAN:  Daleen Bo. Gaetano Net, M.D. DATE OF BIRTH:  [birth year]  DATE OF PROCEDURE:  12/29/2016 DATE OF DISCHARGE:                              OPERATIVE REPORT   PREOPERATIVE DIAGNOSES: 1. Intrauterine pregnancy at 32-3/7th weeks. 2. Non-reassuring fetal heart tracing. 3. Desires permanent sterilization.  POSTOPERATIVE DIAGNOSES: 1. Intrauterine pregnancy at 32-3/7th weeks. 2. Non-reassuring fetal heart tracing. 3. Desires permanent sterilization.  PROCEDURES: 1. Repeat low transverse cesarean section. 2. Bilateral tubal ligation.  SURGEON:  Daleen Bo. Gaetano Net, M.D.  ANESTHESIA:  Spinal, Lyn Hollingshead, M.D.  ESTIMATED BLOOD LOSS:  500 mL.  SPECIMENS:  Placenta and bilateral fallopian tube segments to Pathology.  FINDINGS:  Viable female infant.  Apgars, arterial cord pH, birth weight pending.  INDICATIONS AND CONSENT:  This patient is a 31 year old G4, P2, with a history of 2 previous preterm deliveries and a previous cesarean section.  She was admitted with preterm labor and cervical change.  She received magnesium sulfate and betamethasone.  Uterine contractions became quiescent.  However, she begins to feel more uncomfortable. Uterus remained soft.  Fetal heart tones were initially baseline of 120s to 140s with good accelerations.  Baseline dwindled down to the upper 90s and low 100s with no accelerations.  In addition, variable decelerations began getting progressively deeper.  Recommendation for delivery was made.  Potential risks and complications were reviewed preoperatively including, but not limited to, infection, organ damage, bleeding requiring transfusion of blood products with HIV and hepatitis acquisition, DVT, PE, pneumonia.  The patient requests bilateral tubal ligation.  It was discussed prior to surgery  as well.  Options were reviewed.  Permanence of the procedure was emphasized as well as failure rate and increased ectopic risk.  She is adamant she wants bilateral tubal ligation for permanent sterilization.  Consent was signed on the chart.  The patient states she understands and agrees.  DESCRIPTION OF PROCEDURE:  The patient was taken to the operating room and she was identified.  Spinal anesthetic was placed per Dr. Jillyn Hidden. She was placed in dorsal supine position with a 15-degree left lateral wedge.  She was prepped vaginally.  Bladder has a Foley catheter placed and she was prepped abdominally.  After the abdominal ChloraPrep is dried with a 3-minute drying time, she was draped in a sterile fashion. Time-out was undertaken.  After testing for adequate spinal anesthesia, skin was entered through the previous Pfannenstiel scar and dissection was carried out in layers to the peritoneum.  Peritoneum was incised, extended superiorly and inferiorly.  Vesicouterine peritoneum was taken down cephalad laterally.  Bladder flap developed and bladder blade was placed.  Uterus was incised in a low transverse manner, and the uterine cavity was entered bluntly with a hemostat.  Clear fluid was noted. Uterine incision was extended cephalad laterally with fingers.  Vertex was delivered without difficulty.  Baby was delivered and good cry and tone were noted.  After 1 minute, the cord was clamped and cut, and the baby was handed to awaiting Pediatrics team.  Placenta was manually delivered and sent to Pathology.  Uterine cavity was clean.  Uterus was closed in a running locking fashion with 0 Monocryl suture, which achieves good hemostasis.  Left fallopian tube was identified from cornu to the fimbria.  It was grasped in its mid ampullary portion with a Babcock clamp and a knuckle of fallopian tube was doubly ligated with 2 free ties of plain suture.  The intervening knuckle was sharply resected.   Cautery was used to assure hemostasis.  Similar procedure was carried out on the right side.  Lavage was carried out and again good hemostasis was noted.  Anterior peritoneum was closed in a running fashion with 0 Monocryl suture, which was also used to reapproximate the pyramidalis muscle in the midline.  Anterior rectus fascia was closed in a running fashion with a 0 looped PDS.  Subcutaneous layer was closed with interrupted 2-0 plain and the skin was closed in a subcuticular fashion with a 4-0 Vicryl on a Keith needle.  Steri-Strips, honeycomb, and pressure dressing are applied.  All counts were correct.  The patient was taken to the recovery room in stable condition.     Daleen Bo Gaetano Net, M.D.     JET/MEDQ  D:  12/29/2016  T:  12/29/2016  Job:  [id number]

## 2016-12-29 NOTE — Transfer of Care (Signed)
Immediate Anesthesia Transfer of Care Note  Patient: Alyssa [name]  Procedure(s) Performed: REPEAT CESAREAN SECTION WITH BILATERAL TUBAL LIGATION (N/A )  Patient Location: PACU  Anesthesia Type:Spinal  Level of Consciousness: awake, alert  and oriented  Airway & Oxygen Therapy: Patient Spontanous Breathing  Post-op Assessment: Report given to RN and Post -op Vital signs reviewed and stable  Post vital signs: Reviewed and stable  Last Vitals:  Vitals:   12/29/16 0118 12/29/16 0119  BP:  108/66  Pulse:  82  Resp:    Temp:    SpO2: 100%     Last Pain:  Vitals:   12/29/16 0001  TempSrc:   PainSc: 0-No pain         Complications: No apparent anesthesia complications

## 2016-12-29 NOTE — Anesthesia Postprocedure Evaluation (Signed)
Anesthesia Post Note  Patient: Alyssa [name]  Procedure(s) Performed: REPEAT CESAREAN SECTION WITH BILATERAL TUBAL LIGATION (N/A )     Patient location during evaluation: Mother Baby Anesthesia Type: Spinal Level of consciousness: awake and alert and oriented Pain management: satisfactory to patient Vital Signs Assessment: post-procedure vital signs reviewed and stable Respiratory status: respiratory function stable and spontaneous breathing Cardiovascular status: blood pressure returned to baseline Postop Assessment: no headache, no backache, spinal receding, patient able to bend at knees and adequate PO intake Anesthetic complications: no    Last Vitals:  Vitals:   12/29/16 0545 12/29/16 0638  BP: (!) 103/51 93/60  Pulse: 74 72  Resp: 16 16  Temp: 36.9 C 36.8 C  SpO2: 99% 99%    Last Pain:  Vitals:   12/29/16 0710  TempSrc:   PainSc: Asleep   Pain Goal:                 Santhosh Gulino

## 2016-12-29 NOTE — Anesthesia Procedure Notes (Signed)
Spinal  Patient location during procedure: OR Start time: 12/29/2016 2:03 AM End time: 12/29/2016 2:06 AM Staffing Anesthesiologist: Lyn Hollingshead, MD Performed: anesthesiologist  Preanesthetic Checklist Completed: patient identified, surgical consent, pre-op evaluation, timeout performed, IV checked, risks and benefits discussed and monitors and equipment checked Spinal Block Patient position: sitting Prep: site prepped and draped and DuraPrep Patient monitoring: continuous pulse ox and blood pressure Approach: midline Location: L3-4 Injection technique: single-shot Needle Needle type: Pencan  Needle gauge: 24 G Needle length: 10 cm Needle insertion depth: 4 cm Assessment Sensory level: T4

## 2016-12-29 NOTE — Progress Notes (Signed)
FHT baseline on admission 120-140s with good accelerations     Over the last hour baseline now 90s-low 100s with variable decels getting progressively deeper. No response to scalp stim  UCs irritability  Cx 3/50/vtx  No blood on exam glove  Patient C/O more abdominal discomfort  A/P: non reassuring FHT         In view of sustained change in FHT I recommend delivery         Patient wants repeat cesarean and BTL. I reviewed procedure and risks including infection, organ damage, bleeding/transfusion-HIV/Hep, DVT/PE, pneumonia. D/W BTL emphasizing permanence, failure rate and increased ectopic risk. Patient states she understands and agrees.

## 2016-12-30 ENCOUNTER — Encounter (HOSPITAL_COMMUNITY): Payer: Self-pay | Admitting: *Deleted

## 2016-12-30 LAB — RPR: RPR: NONREACTIVE

## 2016-12-30 MED ORDER — ONDANSETRON HCL 4 MG PO TABS
4.0000 mg | ORAL_TABLET | Freq: Three times a day (TID) | ORAL | Status: DC | PRN
  Administered 2016-12-30 – 2016-12-31 (×3): 4 mg via ORAL
  Filled 2016-12-30 (×4): qty 1

## 2016-12-30 NOTE — Progress Notes (Signed)
Subjective: Postpartum Day 1: Cesarean Delivery Patient reports tolerating PO, + flatus and no problems voiding.    Objective: Vital signs in last 24 hours: Temp:  [97.7 F (36.5 C)-98.7 F (37.1 C)] 98.7 F (37.1 C) (12/08 2354) Pulse Rate:  [51-82] 54 (12/08 2354) Resp:  [15-16] 15 (12/08 2354) BP: (88-99)/(47-54) 88/53 (12/08 2354) SpO2:  [98 %-100 %] 99 % (12/08 2354)  Physical Exam:  General: alert, cooperative and no distress Lochia: appropriate Uterine Fundus: firm Incision: healing well DVT Evaluation: No evidence of DVT seen on physical exam.  Recent Labs    12/28/16 1615 12/29/16 0632  HGB 10.6* 9.9*  HCT 32.6* 29.4*    Assessment/Plan: Status post Cesarean section. Doing well postoperatively.  Continue current care.  Shon Millet II 12/30/2016, 8:04 AM

## 2016-12-31 NOTE — Progress Notes (Signed)
Subjective: Postpartum Day 2: Cesarean Delivery Patient reports tolerating PO.    Objective: Vital signs in last 24 hours: Temp:  [98 F (36.7 C)-98.1 F (36.7 C)] 98.1 F (36.7 C) (12/10 0836) Pulse Rate:  [66-93] 66 (12/10 0836) Resp:  [15-18] 16 (12/10 0836) BP: (85-100)/(46-63) 85/46 (12/10 0836) SpO2:  [99 %-100 %] 99 % (12/10 0836)  Physical Exam:  General: alert Lochia: appropriate Uterine Fundus: firm Incision: healing well DVT Evaluation: No evidence of DVT seen on physical exam.  Recent Labs    12/28/16 1615 12/29/16 0632  HGB 10.6* 9.9*  HCT 32.6* 29.4*    Assessment/Plan: Status post Cesarean section. Doing well postoperatively.  Continue current care Baby on RA in NICU Plan D/C in am.  Margarette Asal 12/31/2016, 9:10 AM

## 2017-01-01 LAB — TYPE AND SCREEN
ABO/RH(D): A NEG
ANTIBODY SCREEN: POSITIVE
UNIT DIVISION: 0
Unit division: 0

## 2017-01-01 LAB — BPAM RBC
BLOOD PRODUCT EXPIRATION DATE: 201812252359
Blood Product Expiration Date: 201812222359
UNIT TYPE AND RH: 9500
Unit Type and Rh: 9500

## 2017-01-01 LAB — GC/CHLAMYDIA PROBE AMP (~~LOC~~) NOT AT ARMC
CHLAMYDIA, DNA PROBE: NEGATIVE
NEISSERIA GONORRHEA: NEGATIVE

## 2017-01-01 MED ORDER — IBUPROFEN 600 MG PO TABS: 600.0000 mg | ORAL_TABLET | Freq: Four times a day (QID) | ORAL | 0 refills | Status: DC

## 2017-01-01 MED ORDER — ONDANSETRON HCL 4 MG PO TABS: 4.0000 mg | ORAL_TABLET | Freq: Three times a day (TID) | ORAL | 0 refills | Status: DC | PRN

## 2017-01-01 MED ORDER — OXYCODONE HCL 5 MG PO TABS: 5.0000 mg | ORAL_TABLET | ORAL | 0 refills | Status: DC | PRN

## 2017-01-01 NOTE — Progress Notes (Signed)
Patient screened out for psychosocial assessment since none of the following apply:  Psychosocial stressors documented in mother or baby's chart  Gestation less than 31 weeks  Code at delivery   Infant with anomalies  Please contact the Clinical Social Worker if specific needs arise, or by MOB's request.  Laurey Arrow, MSW, LCSW Clinical Social Work [phone]

## 2017-01-01 NOTE — Progress Notes (Signed)
Pt discharged with printed instructions. Pt verbalized an understanding. No concerns noted. Shantanique Hodo L Kristyanna Barcelo, RN 

## 2017-01-01 NOTE — Discharge Summary (Signed)
Obstetric Discharge Summary Reason for Admission: onset of labor Prenatal Procedures: ultrasound Intrapartum Procedures: cesarean: low cervical, transverse and tubal ligation Postpartum Procedures: none Complications-Operative and Postpartum: none Hemoglobin  Date Value Ref Range Status  12/29/2016 9.9 ([name]) 12.0 - 15.0 g/dL Final   HCT  Date Value Ref Range Status  12/29/2016 29.4 ([name]) 36.0 - 46.0 % Final    Physical Exam:  General: alert, cooperative and appears stated age 31: appropriate Uterine Fundus: firm Incision: healing well, no significant drainage, no dehiscence, no significant erythema DVT Evaluation: No evidence of DVT seen on physical exam.  Discharge Diagnoses: Premature labor  Discharge Information: Date: 01/01/2017 Activity: pelvic rest Diet: routine Medications: Ibuprofen, Percocet and zofran Condition: improved Instructions: refer to practice specific booklet Discharge to: home   Newborn Data: Live born female  Birth Weight: 4 lb 8.3 oz (2050 g) APGAR: 8, 9  Newborn Delivery   Birth date/time:  12/29/2016 02:30:00 Delivery type:  C-Section, Low Transverse C-section categorization:  Repeat     NICU.  Alyssa [name] 01/01/2017, 9:44 AM

## 2017-01-02 ENCOUNTER — Ambulatory Visit: Payer: Self-pay

## 2017-01-02 NOTE — Lactation Note (Signed)
This note was copied from a baby's chart. Lactation Consultation Note  Patient Name: Boy Lerline Back Today's Date: 01/02/2017   Returned mom's call in regards to needing a DEBP. She has ordered her pump and it wont come in for 3-5 business days. Mom was given phone # for [name] to call in regards to renting pump. Mom has pump kit at home. Mom is not a [facility] client. Mom to call with further questions/concerns.      Maternal Data    Feeding Feeding Type: Donor Breast Milk Length of feed: 90 min  LATCH Score                   Interventions    Lactation Tools Discussed/Used     Consult Status      Debby Freiberg Deyon Chizek 01/02/2017, 11:17 AM

## 2017-01-03 ENCOUNTER — Ambulatory Visit: Payer: Self-pay

## 2017-01-03 DIAGNOSIS — Z3482 Encounter for supervision of other normal pregnancy, second trimester: Secondary | ICD-10-CM | POA: Diagnosis not present

## 2017-01-03 DIAGNOSIS — Z3483 Encounter for supervision of other normal pregnancy, third trimester: Secondary | ICD-10-CM | POA: Diagnosis not present

## 2017-01-03 NOTE — Lactation Note (Signed)
This note was copied from a baby's chart. Lactation Consultation Note  Patient Name: Alyssa [name] Today's Date: 01/03/2017   Baby 4 days old in NICU.  Per RN mother came to unit engorged today. RN directed mother to pumping rooms.   Mother has history of breast augmentation transaxillary surgery. Mother states reason for surgery in 2010 possibly for insufficient glandular tissue. She has had 2 previous preterm infants and pumped with both but states she has minimal milk production. Referred mother to [email or web address] and hands on pumping video. Encouraged her to pump q2.5-3 hours with either manual our DEBP. Mother states [name] [facility] was closed last night when she arrived. Provided mother w/ cleaning supplies and bottles. Demonstrated how to compress breast to increase supply,     Maternal Data    Feeding Feeding Type: Donor Breast Milk Length of feed: 120 min  LATCH Score                   Interventions    Lactation Tools Discussed/Used     Consult Status      Carlye Grippe 01/03/2017, 11:45 AM

## 2017-02-04 DIAGNOSIS — Z1389 Encounter for screening for other disorder: Secondary | ICD-10-CM | POA: Diagnosis not present

## 2017-02-14 ENCOUNTER — Inpatient Hospital Stay (HOSPITAL_COMMUNITY): Admission: AD | Admit: 2017-02-14 | Payer: 59 | Source: Ambulatory Visit | Admitting: Obstetrics & Gynecology

## 2017-04-08 ENCOUNTER — Ambulatory Visit: Payer: Self-pay | Admitting: Nurse Practitioner

## 2017-04-08 VITALS — BP 100/64 | HR 105 | Temp 98.9°F | Wt 122.8 lb

## 2017-04-08 DIAGNOSIS — J029 Acute pharyngitis, unspecified: Secondary | ICD-10-CM

## 2017-04-08 LAB — POCT INFLUENZA A/B
Influenza A, POC: NEGATIVE
Influenza B, POC: NEGATIVE

## 2017-04-08 LAB — POCT RAPID STREP A (OFFICE): RAPID STREP A SCREEN: NEGATIVE

## 2017-04-08 NOTE — Patient Instructions (Addendum)

## 2017-04-08 NOTE — Progress Notes (Signed)
Subjective:     Alyssa [name] is a 32 y.o. female who presents for evaluation of sore throat. Associated symptoms include fevers up to 101.5 degrees, chills, sore throat and swollen glands. Onset of symptoms was 1 day ago, and have been gradually worsening since that time. Currently rates throat pain 7/10.  She is drinking plenty of fluids. She has not had a recent close exposure to someone with proven streptococcal pharyngitis.  Patient has been taking Ibuprofen for fever and throat pain.  Patient does not smoke and denies history of seasonal allergies.   The following portions of the patient's history were reviewed and updated as appropriate: allergies, current medications and past medical history.  Review of Systems Constitutional: positive for chills, fevers and malaise, negative for anorexia, sweats and weight loss Eyes: negative Ears, nose, mouth, throat, and face: positive for sore throat and right ear pressure, negative for ear drainage, earaches and nasal congestion Respiratory: negative Cardiovascular: negative Gastrointestinal: positive for nausea and decreased appetite, negative for abdominal pain, constipation, diarrhea and vomiting Neurological: positive for headaches, negative for coordination problems, dizziness, paresthesia, speech problems and weakness Allergic/Immunologic: negative    Objective:    BP 100/64   Pulse (!) 105   Temp 98.9 F (37.2 C)   Wt 122 lb 12.8 oz (55.7 kg)   SpO2 98%   BMI 21.75 kg/m  General appearance: alert, cooperative and no distress Head: Normocephalic, without obvious abnormality, atraumatic Eyes: conjunctivae/corneas clear. PERRL, EOM's intact. Fundi benign. Ears: normal TM's and external ear canals both ears Nose: no discharge, turbinates swollen, inflamed, no sinus tenderness Throat: abnormal findings: moderate oropharyngeal erythema Lungs: clear to auscultation bilaterally Heart: regular rate and rhythm, S1, S2 normal, no murmur,  click, rub or gallop Pulses: 2+ and symmetric Skin: Skin color, texture, turgor normal. No rashes or lesions Lymph nodes: cervical lymphadenopathy bilaterally Neurologic: Grossly normal  Laboratory Strep test done. Results:negative.    Assessment:    Acute pharyngitis, likely  Viral pharyngitis.    Plan:    Use of OTC analgesics recommended as well as salt water gargles. Follow up as needed. Patient instructed to continue use of Tylenol or Ibuprofen for pain.  Patient declined Magic Mouthwash.  Patient to use saltwater gargles, honey, warm/cool liquids for comfort.  Patient to go to ER if drooling, difficulty breathing, SOB or other concerns.  Patient will follow  up in 7-10 days if symptoms persist.   Work note provided.

## 2017-04-10 ENCOUNTER — Ambulatory Visit (INDEPENDENT_AMBULATORY_CARE_PROVIDER_SITE_OTHER): Payer: Self-pay | Admitting: Nurse Practitioner

## 2017-04-10 VITALS — BP 108/70 | HR 82 | Temp 98.8°F | Resp 20

## 2017-04-10 DIAGNOSIS — J029 Acute pharyngitis, unspecified: Secondary | ICD-10-CM

## 2017-04-10 DIAGNOSIS — J020 Streptococcal pharyngitis: Secondary | ICD-10-CM

## 2017-04-10 MED ORDER — AMOXICILLIN 875 MG PO TABS: 875.0000 mg | ORAL_TABLET | Freq: Two times a day (BID) | ORAL | 0 refills | Status: DC

## 2017-04-10 NOTE — Patient Instructions (Addendum)
Strep Throat Strep throat is a bacterial infection of the throat. Your health care provider may call the infection tonsillitis or pharyngitis, depending on whether there is swelling in the tonsils or at the back of the throat. Strep throat is most common during the cold months of the year in children who are 48-31 years of age, but it can happen during any season in people of any age. This infection is spread from person to person (contagious) through coughing, sneezing, or close contact. What are the causes? Strep throat is caused by the bacteria called Streptococcus pyogenes. What increases the risk? This condition is more likely to develop in:  People who spend time in crowded places where the infection can spread easily.  People who have close contact with someone who has strep throat.  What are the signs or symptoms? Symptoms of this condition include:  Fever or chills.  Redness, swelling, or pain in the tonsils or throat.  Pain or difficulty when swallowing.  White or yellow spots on the tonsils or throat.  Swollen, tender glands in the neck or under the jaw.  Red rash all over the body (rare).  How is this diagnosed? This condition is diagnosed by performing a rapid strep test or by taking a swab of your throat (throat culture test). Results from a rapid strep test are usually ready in a few minutes, but throat culture test results are available after one or two days. How is this treated? This condition is treated with antibiotic medicine. Follow these instructions at home: Medicines  Take over-the-counter and prescription medicines only as told by your health care provider.  Take your antibiotic as told by your health care provider. Do not stop taking the antibiotic even if you start to feel better.  Have family members who also have a sore throat or fever tested for strep throat. They may need antibiotics if they have the strep infection. Eating and drinking  Do not  share food, drinking cups, or personal items that could cause the infection to spread to other people.  If swallowing is difficult, try eating soft foods until your sore throat feels better.  Drink enough fluid to keep your urine clear or pale yellow. General instructions  Gargle with a salt-water mixture 3-4 times per day or as needed. To make a salt-water mixture, completely dissolve -1 tsp of salt in 1 cup of warm water.  Make sure that all household members wash their hands well.  Get plenty of rest.  Stay home from school or work until you have been taking antibiotics for 24 hours.   Use of OTC analgesics recommended as well as salt water gargles.  Risk of peritonsillar abscess formation.  Patient advised that he will be infectious for 24 hours after starting antibiotics.   Patient instructed to change toothbrush in 3 days.  Continue symptomatic treatment, Advil or Tylenol for pain, fever or general discomfort.  Patient will increase fluids and attempt to eat soups, bland/BRAT diet.  Patient instructed to follow up in ER if abdominal symptoms persist as no labs or additional workup can be completed here.  Contact a health care provider if:  The glands in your neck continue to get bigger.  You develop a rash, cough, or earache.  You cough up a thick liquid that is green, yellow-brown, or bloody.  You have pain or discomfort that does not get better with medicine.  Your problems seem to be getting worse rather than better.  You have  a fever. Get help right away if:  You have new symptoms, such as vomiting, severe headache, stiff or painful neck, chest pain, or shortness of breath.  You have severe throat pain, drooling, or changes in your voice.  You have swelling of the neck, or the skin on the neck becomes red and tender.  You have signs of dehydration, such as fatigue, dry mouth, and decreased urination.  You become increasingly sleepy, or you cannot wake up  completely.  Your joints become red or painful. This information is not intended to replace advice given to you by your health care provider. Make sure you discuss any questions you have with your health care provider. Document Released: 01/06/2000 Document Revised: 09/07/2015 Document Reviewed: 05/03/2014 Elsevier Interactive Patient Education  [employer].

## 2017-04-10 NOTE — Progress Notes (Signed)
Subjective:     Alyssa [name] is a 32 y.o. female who presents for evaluation of sore throat. Associated symptoms include fevers up to 102 degrees, sore throat and swollen glands. Onset of symptoms was 3 days ago, and have been gradually worsening since that time. She is drinking moderate amounts of fluids. She has not had a recent close exposure to someone with proven streptococcal pharyngitis.  Patient was seen in the office on 3/18 and tested negative for strep, thought to be a viral pharyngitis.  Patient returns with complaints of worsening sore throat and unresolved fever.  Patient has been doing symptomatic treatment at home with no relief.  The following portions of the patient's history were reviewed and updated as appropriate: allergies, current medications and past medical history.  Review of Systems Constitutional: positive for anorexia, chills, fatigue and fevers, negative for malaise and sweats Eyes: negative Ears, nose, mouth, throat, and face: positive for sore throat, negative for ear drainage, earaches and nasal congestion Respiratory: negative Cardiovascular: negative Gastrointestinal: positive for decreased appetite and mid-epigastric abdominal pain with radiation to [name], negative for constipation, diarrhea and vomiting Neurological: negative    Objective:    BP 108/70 (BP Location: Right Arm, Patient Position: Sitting, Cuff Size: Normal)   Pulse 82   Temp 98.8 F (37.1 C) (Oral)   Resp 20   SpO2 99%  General appearance: alert, cooperative, mild distress and due to throat pain Head: Normocephalic, without obvious abnormality, atraumatic Eyes: conjunctivae/corneas clear. PERRL, EOM's intact. Fundi benign. Ears: normal TM's and external ear canals both ears Nose: no discharge, turbinates swollen, inflamed, no sinus tenderness Throat: abnormal findings: moderate oropharyngeal erythema and uvula midline, no exudates Lungs: clear to auscultation bilaterally Heart: regular  rate and rhythm, S1, S2 normal, no murmur, click, rub or gallop Abdomen: abnormal findings:  moderate tenderness in the periumbilical area Pulses: 2+ and symmetric Skin: Skin color, texture, turgor normal. No rashes or lesions Lymph nodes: cervical lympadenopathy Neurologic: Grossly normal  Laboratory Strep test done. Results:positive.    Assessment:    Acute pharyngitis, likely  Strep throat.    Plan:    Patient placed on antibiotics. Use of OTC analgesics recommended as well as salt water gargles. Patient advised of the risk of peritonsillar abscess formation. Patient advised that he will be infectious for 24 hours after starting antibiotics. Follow up as needed. Patient instructed to change toothbrush in 3 days.  Continue symptomatic treatment, Advil or Tylenol for pain, fever or general discomfort.  Patient will increase fluids and attempt to eat soups, bland/BRAT diet.  Patient instructed to follow up in ER if abdominal symptoms persist as no labs or additional workup can be completed here.    Meds ordered this encounter  Medications  . amoxicillin (AMOXIL) 875 MG tablet    Sig: Take 1 tablet (875 mg total) by mouth 2 (two) times daily for 10 days.    Dispense:  20 tablet    Refill:  0    Order Specific Question:   Supervising Provider    Answer:   Ricard Dillon [phone]

## 2017-04-11 ENCOUNTER — Other Ambulatory Visit: Payer: Self-pay

## 2017-04-11 ENCOUNTER — Encounter (HOSPITAL_COMMUNITY): Payer: Self-pay

## 2017-04-11 ENCOUNTER — Telehealth: Payer: Self-pay | Admitting: Internal Medicine

## 2017-04-11 ENCOUNTER — Emergency Department (HOSPITAL_COMMUNITY)
Admission: EM | Admit: 2017-04-11 | Discharge: 2017-04-11 | Disposition: A | Discharge status: Home / Self Care | Payer: 59 | Attending: Emergency Medicine | Admitting: Emergency Medicine

## 2017-04-11 DIAGNOSIS — N3090 Cystitis, unspecified without hematuria: Secondary | ICD-10-CM | POA: Insufficient documentation

## 2017-04-11 DIAGNOSIS — J020 Streptococcal pharyngitis: Secondary | ICD-10-CM | POA: Diagnosis not present

## 2017-04-11 DIAGNOSIS — R52 Pain, unspecified: Secondary | ICD-10-CM

## 2017-04-11 DIAGNOSIS — R1013 Epigastric pain: Secondary | ICD-10-CM | POA: Diagnosis not present

## 2017-04-11 DIAGNOSIS — R110 Nausea: Secondary | ICD-10-CM | POA: Diagnosis not present

## 2017-04-11 DIAGNOSIS — Z79899 Other long term (current) drug therapy: Secondary | ICD-10-CM | POA: Insufficient documentation

## 2017-04-11 LAB — URINALYSIS, ROUTINE W REFLEX MICROSCOPIC
Bilirubin Urine: NEGATIVE
Glucose, UA: NEGATIVE mg/dL
Ketones, ur: 20 mg/dL — AB
NITRITE: NEGATIVE
PH: 5 (ref 5.0–8.0)
Protein, ur: 30 mg/dL — AB
Specific Gravity, Urine: 1.027 (ref 1.005–1.030)

## 2017-04-11 LAB — COMPREHENSIVE METABOLIC PANEL
ALBUMIN: 3.7 g/dL (ref 3.5–5.0)
ALK PHOS: 75 U/L (ref 38–126)
ALT: 11 U/L — ABNORMAL LOW (ref 14–54)
ANION GAP: 10 (ref 5–15)
AST: 14 U/L — ABNORMAL LOW (ref 15–41)
BILIRUBIN TOTAL: 0.5 mg/dL (ref 0.3–1.2)
BUN: 12 mg/dL (ref 6–20)
CALCIUM: 8.8 mg/dL — AB (ref 8.9–10.3)
CO2: 23 mmol/L (ref 22–32)
Chloride: 105 mmol/L (ref 101–111)
Creatinine, Ser: 0.76 mg/dL (ref 0.44–1.00)
GFR calc Af Amer: >60 mL/min (ref >60)
Glucose, Bld: 100 mg/dL — ABNORMAL HIGH (ref 65–99)
Potassium: 3.9 mmol/L (ref 3.5–5.1)
Sodium: 138 mmol/L (ref 135–145)
TOTAL PROTEIN: 6.5 g/dL (ref 6.5–8.1)

## 2017-04-11 LAB — CBC
HCT: 37.1 % (ref 36.0–46.0)
HEMOGLOBIN: 12.1 g/dL (ref 12.0–15.0)
MCH: 28.7 pg (ref 26.0–34.0)
MCHC: 32.6 g/dL (ref 30.0–36.0)
MCV: 87.9 fL (ref 78.0–100.0)
Platelets: 205 10*3/uL (ref 150–400)
RBC: 4.22 MIL/uL (ref 3.87–5.11)
RDW: 12.9 % (ref 11.5–15.5)
WBC: 6.4 10*3/uL (ref 4.0–10.5)

## 2017-04-11 LAB — I-STAT BETA HCG BLOOD, ED (MC, WL, AP ONLY): I-stat hCG, quantitative: <5 m[IU]/mL (ref <5)

## 2017-04-11 LAB — LIPASE, BLOOD: Lipase: 38 U/L (ref 11–51)

## 2017-04-11 MED ORDER — NITROFURANTOIN MONOHYD MACRO 100 MG PO CAPS: 100.0000 mg | ORAL_CAPSULE | Freq: Two times a day (BID) | ORAL | 0 refills | Status: DC

## 2017-04-11 MED ORDER — PENICILLIN G BENZATHINE & PROC 1200000 UNIT/2ML IM SUSP
1.2000 10*6.[IU] | Freq: Once | INTRAMUSCULAR | Status: AC
  Administered 2017-04-11: 1.2 10*6.[IU] via INTRAMUSCULAR
  Filled 2017-04-11: qty 2

## 2017-04-11 MED ORDER — OMEPRAZOLE 40 MG PO CPDR: 40.0000 mg | DELAYED_RELEASE_CAPSULE | Freq: Every day | ORAL | 0 refills | Status: DC

## 2017-04-11 MED ORDER — GI COCKTAIL ~~LOC~~
30.0000 mL | Freq: Once | ORAL | Status: AC
  Administered 2017-04-11: 30 mL via ORAL
  Filled 2017-04-11 (×2): qty 30

## 2017-04-11 NOTE — Discharge Instructions (Signed)
It is normal to have epigastric pain with strep throat. Your lab work was reassuring.   Please take prilosec daily to help with your epigastric pain.   We treated you for strep throat in the emergency department with penicillin shot. Please stop taking augmentin. You have a urinary tract infection, I have written you a prescription for an antibiotic. Please take this twice a day for the next five days.   Follow-up with your regular doctor for recheck in 2 days.  Return to the emergency department if you have vomiting that will not stop, fever greater than 100.4 F, or have worsening symptoms.

## 2017-04-11 NOTE — Telephone Encounter (Signed)
Having several days of epigastric pain radiating to back. Started before she had sore throat and was dx w/ strep   See chart for time course, etc  She was in ED yesterday to early today   Labs ok  No imaging done  Fever gone No vomiting  Plan  abd [place] re epigastric pain and tenderness tomorrow AM To go back to ED if worse, unable to tolerate

## 2017-04-11 NOTE — ED Triage Notes (Signed)
Pt coming from home with complaint of abdominal pain in the epigastric region that started last night. Pt states she has taken tums, alkaseltzer, zantac with no relief. Tested positive for strep yesterday and started on Augmentin.

## 2017-04-11 NOTE — ED Notes (Signed)
Patient complaining of nausea and pain.

## 2017-04-11 NOTE — Telephone Encounter (Signed)
Patient is scheduled for [place].  I notified her of the recommendations and the appt date and times.  She is advised to arrive at [facility] 9:45 04/12/17 and be NPO after midnight

## 2017-04-11 NOTE — ED Notes (Signed)
No signature pad available, pt verbalized understanding of discharge instructions.

## 2017-04-11 NOTE — ED Provider Notes (Signed)
[facility] EMERGENCY DEPARTMENT Provider Note   CSN: [id number] Arrival date & time: 04/11/17  3716     History   Chief Complaint Chief Complaint  Patient presents with  . Abdominal Pain    HPI Alyssa [name] is a 32 y.o. female.  HPI   [name] is a 32yo female with a history of GERD who presents to the emergency department for evaluation of epigastric pain.  Patient was recently diagnosed with strep pharyngitis and started Augmentin yesterday.  She states that beginning yesterday she has had worsening epigastric pain which comes and goes.  Pain comes every few minutes and lasts of couple of seconds.  No known trigger.  States that when the pain comes it is 9/10 in severity and feels "cramping" in nature.  Pain occasionally radiates to her [name].  She tried taking Tums, Alka-Seltzer and Zantac yesterday without significant improvement.  She states that she feels somewhat nauseous, but has not had any vomiting.  Denies history of peptic ulcer disease.  States that her last bowel movement was yesterday and normal, she denies diarrhea or blood in the stool.  She also reports that she has had increased urinary urgency, feeling like she is not voiding completely for the past several days.  She denies fevers, chills, vaginal discharge, dysuria, chest pain, shortness of breath, lightheadedness, syncope.  She has had 2 C-sections, denies any other abdominal surgeries.  Past Medical History:  Diagnosis Date  . GERD (gastroesophageal reflux disease)     Patient Active Problem List   Diagnosis Date Noted  . Preterm labor 12/28/2016    Past Surgical History:  Procedure Laterality Date  . CESAREAN SECTION    . CESAREAN SECTION WITH BILATERAL TUBAL LIGATION N/A 12/29/2016   Procedure: REPEAT CESAREAN SECTION WITH BILATERAL TUBAL LIGATION;  Surgeon: Linda Hedges, DO;  Location: [facility];  Service: Obstetrics;  Laterality: N/A;  . PLACEMENT OF BREAST IMPLANTS       OB History    Gravida  4   Para  3   Term      Preterm  3   AB  1   Living  3     SAB  1   TAB      Ectopic      Multiple  0   Live Births  3            Home Medications    Prior to Admission medications   Medication Sig Start Date End Date Taking? Authorizing Provider  amoxicillin (AMOXIL) 875 MG tablet Take 1 tablet (875 mg total) by mouth 2 (two) times daily for 10 days. 04/10/17 04/20/17  Kara Dies, NP  calcium carbonate (TUMS - DOSED IN MG ELEMENTAL CALCIUM) 500 MG chewable tablet Chew 1-2 tablets by mouth daily as needed for indigestion or heartburn.    [provider]  ibuprofen (ADVIL,MOTRIN) 600 MG tablet Take 1 tablet (600 mg total) by mouth every 6 (six) hours. 01/01/17   Dian Queen, MD  IRON PO Take 1 tablet by mouth at bedtime.    [provider]  ondansetron (ZOFRAN) 4 MG tablet Take 1 tablet (4 mg total) by mouth every 8 (eight) hours as needed for nausea or vomiting. 01/01/17   Dian Queen, MD  oxyCODONE (OXY IR/ROXICODONE) 5 MG immediate release tablet Take 1 tablet (5 mg total) by mouth every 4 (four) hours as needed (pain scale 4-7). 01/01/17   Dian Queen, MD  Prenatal Vit-Fe Fumarate-FA (  PRENATAL MULTIVITAMIN) TABS tablet Take 1 tablet by mouth daily at 12 noon.    [provider]    Family History History reviewed. No pertinent family history.  Social History Social History   Tobacco Use  . Smoking status: Never Smoker  . Smokeless tobacco: Never Used  Substance Use Topics  . Alcohol use: No    Frequency: Never    Comment: not during pregnancy  . Drug use: No     Allergies   Patient has no known allergies.   Review of Systems Review of Systems  Constitutional: Negative for chills and fever.  Eyes: Negative for visual disturbance.  Respiratory: Negative for shortness of breath.   Cardiovascular: Negative for chest pain.  Gastrointestinal: Positive for abdominal pain  (epigastric) and nausea. Negative for blood in stool, diarrhea and vomiting.  Genitourinary: Positive for urgency. Negative for difficulty urinating, dysuria, frequency and vaginal discharge.  Musculoskeletal: Positive for [name] pain (occasionally pain radiates to bilateral [name]). Negative for gait problem.  Skin: Negative for rash.  Neurological: Negative for syncope, light-headedness and headaches.  Psychiatric/Behavioral: Negative for agitation.     Physical Exam Updated Vital Signs BP 116/85   Pulse 61   Temp 97.6 F (36.4 C) (Oral)   Resp 18   LMP 03/12/2017 (Within Days)   SpO2 100%   Physical Exam  Constitutional: She is oriented to person, place, and time. She appears well-developed and well-nourished. No distress.  Sitting at bedside in no apparent distress.  HENT:  Head: Normocephalic and atraumatic.  Mucous membranes moist.  Posterior oropharynx erythematous.  2+ tonsillar edema, no exudate.  No tonsillar exudate.  Uvula midline.  Airway patent and able to handle oral secretions.  Eyes: Pupils are equal, round, and reactive to light. Conjunctivae are normal. Right eye exhibits no discharge. Left eye exhibits no discharge.  Neck: Normal range of motion. Neck supple.  Cardiovascular: Normal rate, regular rhythm and intact distal pulses. Exam reveals no friction rub.  No murmur heard. Pulmonary/Chest: Effort normal and breath sounds normal. No stridor. No respiratory distress. She has no wheezes. She has no rales.  Abdominal: Soft.  Abdomen soft and nondistended.  Tender to palpation in the epigastric area with guarding.  No rebound tenderness.  Murphy's point negative.  No CVA tenderness.  Musculoskeletal: Normal range of motion.  No t-spine or l-spine tenderness. No paraspinal muscle tenderness. No rash or ecchymosis over the [name].   Neurological: She is alert and oriented to person, place, and time. Coordination normal.  Skin: Skin is warm and dry. Capillary refill takes  less than 2 seconds. She is not diaphoretic.  Psychiatric: She has a normal mood and affect. Her behavior is normal.  Nursing note and vitals reviewed.    ED Treatments / Results  Labs (all labs ordered are listed, but only abnormal results are displayed) Labs Reviewed  COMPREHENSIVE METABOLIC PANEL - Abnormal; Notable for the following components:      Result Value   Glucose, Bld 100 (*)    Calcium 8.8 (*)    AST 14 (*)    ALT 11 (*)    All other components within normal limits  URINALYSIS, ROUTINE W REFLEX MICROSCOPIC - Abnormal; Notable for the following components:   APPearance CLOUDY (*)    Hgb urine dipstick MODERATE (*)    Ketones, ur 20 (*)    Protein, ur 30 (*)    Leukocytes, UA TRACE (*)    Bacteria, UA MANY (*)    Squamous  Epithelial / LPF 6-30 (*)    Non Squamous Epithelial 0-5 (*)    All other components within normal limits  LIPASE, BLOOD  CBC  I-STAT BETA HCG BLOOD, ED ([facility], [facility], [facility] ONLY)    EKG  EKG Interpretation None       Radiology No results found.  Procedures Procedures (including critical care time)  Medications Ordered in ED Medications  gi cocktail (Maalox,Lidocaine,Donnatal) (30 mLs Oral Given 04/11/17 0646)  penicillin g procaine-penicillin g benzathine (BICILLIN-CR) injection 600000-600000 units (1.2 Million Units Intramuscular Given 04/11/17 0826)     Initial Impression / Assessment and Plan / ED Course  I have reviewed the triage vital signs and the nursing notes.  Pertinent labs & imaging results that were available during my care of the patient were reviewed by me and considered in my medical decision making (see chart for details).     Presents for evaluation of epigastric pain and nausea, was recently diagnosed with strep pharyngitis and started on Augmentin yesterday.  On exam she is afebrile and nontoxic-appearing.  She has epigastric tenderness to palpation.  Murphy sign negative.   Results reviewed.  CBC within normal  limits, no leukocytosis.  CMP without major electrolyte abnormalities, no elevation in liver enzymes, kidney function good.  I-STAT beta hCG negative.  Lipase negative. UA reveals evidence of infection with many bacteria and white blood cells on microscopic.  Patient also reports urinary urgency.  Suspect patient's symptoms of epigastric pain are related to recently starting Augmentin and diagnosis of strep pharyngitis which can cause epigastric pain.  Have given patient a GI cocktail in the emergency department.  On recheck patient states that she is somewhat improved.  Repeat abdominal exam soft, mildly tender to palpation in the epigastric area but no guarding or rebound tenderness.  No concern for surgical abdomen given relatively benign exam and VSS. Do not suspect acute cholecystitis, given no tenderness in RUQ and liver enzymes normal. Do not suspect appendicitis or diverticulitis given no lower abdominal pain or leukocytosis. No blood in urine or difficulty with urination to suggest nephrolithiasis. Lipase negative, no concern for pancreatitis. No vaginal discharge or lower abdominal pain to suggest PID or GYN etiology.   Have counseled patient on use of PPI for her symptoms.  Will also prescribe Macrobid for cystitis, exam non-concerning for pyleonephritis given no fever or CVA tenderness. Patient given Penicillin IM for strep pharyngitis and counseled to stop taking augmentin which may be contributing to her epigastric pain.  Counseled patient to have recheck with her primary care doctor in 2 days.  Have also counseled her on return precautions.  Patient agrees and voiced understanding to the above plan.  Discussed this patient with Dr. Nicholes Stairs who also agrees with plan and discharge.  Final Clinical Impressions(s) / ED Diagnoses   Final diagnoses:  Epigastric pain  Cystitis  Strep pharyngitis    ED Discharge Orders        Ordered    omeprazole (PRILOSEC) 40 MG capsule  Daily     04/11/17  0747    nitrofurantoin, macrocrystal-monohydrate, (MACROBID) 100 MG capsule  2 times daily     04/11/17 0747       Glyn Ade, PA-C 04/11/17 0857    Palumbo, April, MD 04/12/17 0127

## 2017-04-12 ENCOUNTER — Ambulatory Visit (INDEPENDENT_AMBULATORY_CARE_PROVIDER_SITE_OTHER): Payer: 59 | Admitting: Physician Assistant

## 2017-04-12 ENCOUNTER — Ambulatory Visit (HOSPITAL_COMMUNITY)
Admission: RE | Admit: 2017-04-12 | Discharge: 2017-04-12 | Disposition: A | Discharge status: Home / Self Care | Payer: 59 | Source: Ambulatory Visit | Attending: Internal Medicine | Admitting: Internal Medicine

## 2017-04-12 ENCOUNTER — Encounter: Payer: Self-pay | Admitting: Physician Assistant

## 2017-04-12 ENCOUNTER — Telehealth: Payer: Self-pay | Admitting: Emergency Medicine

## 2017-04-12 VITALS — BP 112/64 | HR 72 | Ht 63.0 in | Wt 118.2 lb

## 2017-04-12 DIAGNOSIS — R1013 Epigastric pain: Secondary | ICD-10-CM

## 2017-04-12 DIAGNOSIS — R52 Pain, unspecified: Secondary | ICD-10-CM

## 2017-04-12 DIAGNOSIS — K219 Gastro-esophageal reflux disease without esophagitis: Secondary | ICD-10-CM | POA: Diagnosis not present

## 2017-04-12 DIAGNOSIS — R110 Nausea: Secondary | ICD-10-CM | POA: Diagnosis not present

## 2017-04-12 MED ORDER — DICYCLOMINE HCL 10 MG PO CAPS: 10.0000 mg | ORAL_CAPSULE | Freq: Three times a day (TID) | ORAL | 1 refills | Status: DC

## 2017-04-12 NOTE — Patient Instructions (Signed)
If you are age 31 or older, your body mass index should be between 23-30. Your Body mass index is 20.95 kg/m. If this is out of the aforementioned range listed, please consider follow up with your Primary Care Provider.  If you are age 51 or younger, your body mass index should be between 19-25. Your Body mass index is 20.95 kg/m. If this is out of the aformentioned range listed, please consider follow up with your Primary Care Provider.   We have sent the following medications to your pharmacy for you to pick up at your convenience: Bentyl 10mg  take 20-30 minutes before meals and at bedtime.  Continue taking your Omeprazole 40mg  everyday 30 minutes to one hour before breakfast.  Please call the office with an update of your symptoms in the next one week and ask for Katina Degree, RN.

## 2017-04-12 NOTE — Telephone Encounter (Signed)
Called pt to follow up with her since her visit with [facility]. She stated she went to ER after her visit with [place] on Wednesday and they discontinued the medication she was placed on by the provider here at [facility] and was placed on macrobid, she stated she isn't feeling much better. I told her I would notify the provider who saw her and to call us if she needs [place], she stated she would

## 2017-04-12 NOTE — Progress Notes (Addendum)
Chief Complaint: Epigastric abdominal pain, nausea  HPI:    [name] is a 32 year old Caucasian female with no significant past medical history, who works as an Actor in our unit upstairs and presents to clinic today after being seen in the ER for epigastric pain.    Per ER notes patient seen 04/11/17 with epigastric pain.  Was recently diagnosed with strep pharyngitis and started Augmentin 04/10/17.  Described epigastric pain for a few days which came and went in waves at 9/10 in severity and cramping.  Labs are normal CMP, lipase and CBC.  Urinalysis was positive for infection, patient started on Nitrofurantoin.  Started on Omeprazole 40 mg daily.    Complete abdominal ultrasound performed today.  The report is not ready for review but Dr. Carlean Purl did review images and tells me this was normal.    Today, explains that she started with epigastric pain on Monday or Tuesday of this week 1-2 days before she was diagnosed with strep throat and started on Augmentin.  Initially this pain would come and go and felt like something was "turning" in there and would radiate through to her back.  It would happen at random times unrelated to eating and has become more frequent over the past few days.  Patient now states this is occurring every 1-2 minutes. Went to the ER when she couldn't get to sleep due to this pain.  Has been on Omeprazole 40 mg for 2 days which is not helping.  Tells me the GI cocktail in the ER did not help.  Also tried various over-the-counter antacids prior to going to the ER which did not help.  Pain is a 4-5/10 and sometimes increases to a 7/10.  Only thing that seems to help a little bit is Tylenol/Ibuprofen and this just helps her "get comfortable".  Denies ever having a pain like this previously or any symptoms of heartburn or reflux other than when she was pregnant with her 3 children. Associated with nausea occasionally.    Does have a 22-month-old at home.  Does admit to drinking a  glass of wine "occasionally".  Also admits to drinking coffee on empty stomach most mornings. No admitted increase in stress other than "with a newborn".    Denies fever, chills, weight loss, anorexia, vomiting, use of NSAIDs before start of abdominal pain, excessive alcohol use, change in diet or other medications.  Past Medical History:  Diagnosis Date  . GERD (gastroesophageal reflux disease)     Past Surgical History:  Procedure Laterality Date  . CESAREAN SECTION    . CESAREAN SECTION WITH BILATERAL TUBAL LIGATION N/A 12/29/2016   Procedure: REPEAT CESAREAN SECTION WITH BILATERAL TUBAL LIGATION;  Surgeon: Linda Hedges, DO;  Location: [facility];  Service: Obstetrics;  Laterality: N/A;  . PLACEMENT OF BREAST IMPLANTS      Current Outpatient Medications  Medication Sig Dispense Refill  . ibuprofen (ADVIL,MOTRIN) 600 MG tablet Take 1 tablet (600 mg total) by mouth every 6 (six) hours. 30 tablet 0  . nitrofurantoin, macrocrystal-monohydrate, (MACROBID) 100 MG capsule Take 1 capsule (100 mg total) by mouth 2 (two) times daily. 10 capsule 0  . omeprazole (PRILOSEC) 40 MG capsule Take 1 capsule (40 mg total) by mouth daily. 30 capsule 0  . dicyclomine (BENTYL) 10 MG capsule Take 1 capsule (10 mg total) by mouth 4 (four) times daily -  before meals and at bedtime. 90 capsule 1   No current facility-administered medications for this visit.  Allergies as of 04/12/2017  . (No Known Allergies)    Family History  Problem Relation Age of Onset  . Diabetes Mother   . High Cholesterol Father     Social History   Socioeconomic History  . Marital status: Married    Spouse name: Not on file  . Number of children: 3  . Years of education: Not on file  . Highest education level: Not on file  Occupational History  . Not on file  Social Needs  . Financial resource strain: Not on file  . Food insecurity:    Worry: Not on file    Inability: Not on file  . Transportation  needs:    Medical: Not on file    Non-medical: Not on file  Tobacco Use  . Smoking status: Never Smoker  . Smokeless tobacco: Never Used  Substance and Sexual Activity  . Alcohol use: No    Frequency: Never    Comment: not during pregnancy  . Drug use: No  . Sexual activity: Yes  Lifestyle  . Physical activity:    Days per week: Not on file    Minutes per session: Not on file  . Stress: Not on file  Relationships  . Social connections:    Talks on phone: Not on file    Gets together: Not on file    Attends religious service: Not on file    Active member of club or organization: Not on file    Attends meetings of clubs or organizations: Not on file    Relationship status: Not on file  . Intimate partner violence:    Fear of current or ex partner: Not on file    Emotionally abused: Not on file    Physically abused: Not on file    Forced sexual activity: Not on file  Other Topics Concern  . Not on file  Social History Narrative  . Not on file    Review of Systems:    Constitutional: No weight loss, fever or chills Skin: No rash  Cardiovascular: No chest pain Respiratory: No SOB  Gastrointestinal: See HPI and otherwise negative Genitourinary: No dysuria Neurological: No headache Musculoskeletal: No new muscle or joint pain Hematologic: No bleeding  Psychiatric: No history of depression or anxiety   Physical Exam:  Vital signs: BP 112/64   Pulse 72   Ht 5\' 3"  (1.6 m)   Wt 118 lb 4 oz (53.6 kg)   Breastfeeding? No   BMI 20.95 kg/m   Constitutional:  Very Pleasant Caucasian female appears to be in NAD, Well developed, Well nourished, alert and cooperative Head:  Normocephalic and atraumatic. Eyes:   PEERL, EOMI. No icterus. Conjunctiva pink. Ears:  Normal auditory acuity. Neck:  Supple Throat: Oral cavity and pharynx without inflammation, swelling or lesion.  Respiratory: Respirations even and unlabored. Lungs clear to auscultation bilaterally.   No wheezes,  crackles, or rhonchi.  Cardiovascular: Normal S1, S2. No MRG. Regular rate and rhythm. No peripheral edema, cyanosis or pallor.  Gastrointestinal:  Soft, nondistended, moderate ttp in epigastrum, No rebound or guarding. Normal bowel sounds. No appreciable masses or hepatomegaly. Rectal:  Not performed.  Msk:  Symmetrical without gross deformities. Without edema, no deformity or joint abnormality.  Neurologic:  Alert and  oriented x4;  grossly normal neurologically.  Skin:   Dry and intact without significant lesions or rashes. Psychiatric: Demonstrates good judgement and reason without abnormal affect or behaviors.  RELEVANT LABS AND IMAGING: CBC    Component  Value Date/Time   WBC 6.4 04/11/2017 0346   RBC 4.22 04/11/2017 0346   HGB 12.1 04/11/2017 0346   HCT 37.1 04/11/2017 0346   PLT 205 04/11/2017 0346   MCV 87.9 04/11/2017 0346   MCH 28.7 04/11/2017 0346   MCHC 32.6 04/11/2017 0346   RDW 12.9 04/11/2017 0346    CMP     Component Value Date/Time   NA 138 04/11/2017 0346   K 3.9 04/11/2017 0346   CL 105 04/11/2017 0346   CO2 23 04/11/2017 0346   GLUCOSE 100 (H) 04/11/2017 0346   BUN 12 04/11/2017 0346   CREATININE 0.76 04/11/2017 0346   CALCIUM 8.8 (L) 04/11/2017 0346   PROT 6.5 04/11/2017 0346   ALBUMIN 3.7 04/11/2017 0346   AST 14 (L) 04/11/2017 0346   ALT 11 (L) 04/11/2017 0346   ALKPHOS 75 04/11/2017 0346   BILITOT 0.5 04/11/2017 0346   GFRNONAA >60 04/11/2017 0346   GFRAA >60 04/11/2017 0346    Assessment: 1.  Epigastric pain: Started 3-4 days ago, prior to use of Augmentin for strep throat, unrelated to meals, so far no change with 2 days of Omeprazole 40mg  or GI cocktail in the ER, no previous history of the same; consider gastritis versus PUD versus functional abdominal pain 2.  Nausea: With above  Plan: 1.  Continue Omeprazole 40 mg daily, 30-60 minutes before breakfast for the next 2-4 weeks. 2.  Prescribed Dicyclomine 10 mg 4 times daily, 20-30 minutes  before meals and at bedtime.  #90 with 1 refill 3.  Reviewed antireflux diet and lifestyle modifications. Recommend she discontinue NSAID use at this time. 4.  Patient will let us know how she is doing in 1 week, if no change in symptoms could consider adding Carafate. 5.  If no change in symptoms in the next 2 weeks, could consider an EGD. Assigned to Dr. Carlean Purl today.  Ellouise Newer, PA-C [facility] 04/12/2017, 2:45 PM  Agree with Ms. Mort Sawyers evaluation and management.  Gatha Mayer, MD, [facility]

## 2017-05-09 DIAGNOSIS — H5213 Myopia, bilateral: Secondary | ICD-10-CM | POA: Diagnosis not present

## 2017-06-07 ENCOUNTER — Encounter: Payer: Self-pay | Admitting: Physician Assistant

## 2017-06-07 ENCOUNTER — Ambulatory Visit (INDEPENDENT_AMBULATORY_CARE_PROVIDER_SITE_OTHER): Payer: 59 | Admitting: Physician Assistant

## 2017-06-07 VITALS — BP 110/70 | HR 61 | Temp 98.0°F | Ht 63.5 in | Wt 120.0 lb

## 2017-06-07 DIAGNOSIS — Z13220 Encounter for screening for lipoid disorders: Secondary | ICD-10-CM | POA: Diagnosis not present

## 2017-06-07 DIAGNOSIS — M546 Pain in thoracic spine: Secondary | ICD-10-CM | POA: Diagnosis not present

## 2017-06-07 DIAGNOSIS — Z114 Encounter for screening for human immunodeficiency virus [HIV]: Secondary | ICD-10-CM

## 2017-06-07 DIAGNOSIS — F341 Dysthymic disorder: Secondary | ICD-10-CM

## 2017-06-07 DIAGNOSIS — R221 Localized swelling, mass and lump, neck: Secondary | ICD-10-CM

## 2017-06-07 DIAGNOSIS — R41840 Attention and concentration deficit: Secondary | ICD-10-CM

## 2017-06-07 DIAGNOSIS — Z0001 Encounter for general adult medical examination with abnormal findings: Secondary | ICD-10-CM | POA: Diagnosis not present

## 2017-06-07 DIAGNOSIS — Z136 Encounter for screening for cardiovascular disorders: Secondary | ICD-10-CM | POA: Diagnosis not present

## 2017-06-07 LAB — COMPREHENSIVE METABOLIC PANEL
ALBUMIN: 4.5 g/dL (ref 3.5–5.2)
ALT: 9 U/L (ref 0–35)
AST: 12 U/L (ref 0–37)
Alkaline Phosphatase: 58 U/L (ref 39–117)
BUN: 16 mg/dL (ref 6–23)
CHLORIDE: 106 meq/L (ref 96–112)
CO2: 25 meq/L (ref 19–32)
CREATININE: 0.73 mg/dL (ref 0.40–1.20)
Calcium: 9.1 mg/dL (ref 8.4–10.5)
GFR: 99.16 mL/min (ref >60.00)
Glucose, Bld: 91 mg/dL (ref 70–99)
POTASSIUM: 4.3 meq/L (ref 3.5–5.1)
SODIUM: 138 meq/L (ref 135–145)
Total Bilirubin: 0.7 mg/dL (ref 0.2–1.2)
Total Protein: 7.1 g/dL (ref 6.0–8.3)

## 2017-06-07 LAB — LIPID PANEL
CHOL/HDL RATIO: 2
CHOLESTEROL: 143 mg/dL (ref 0–200)
HDL: 69.4 mg/dL (ref >39.00)
LDL CALC: 66 mg/dL (ref 0–99)
NonHDL: 73.9
TRIGLYCERIDES: 41 mg/dL (ref 0.0–149.0)
VLDL: 8.2 mg/dL (ref 0.0–40.0)

## 2017-06-07 LAB — CBC WITH DIFFERENTIAL/PLATELET
BASOS PCT: 2.4 % (ref 0.0–3.0)
Basophils Absolute: 0.1 10*3/uL (ref 0.0–0.1)
EOS ABS: 0.1 10*3/uL (ref 0.0–0.7)
EOS PCT: 2.3 % (ref 0.0–5.0)
HCT: 38.9 % (ref 36.0–46.0)
HEMOGLOBIN: 13 g/dL (ref 12.0–15.0)
Lymphocytes Relative: 37.1 % (ref 12.0–46.0)
Lymphs Abs: 1.7 10*3/uL (ref 0.7–4.0)
MCHC: 33.5 g/dL (ref 30.0–36.0)
MCV: 88.7 fl (ref 78.0–100.0)
MONO ABS: 0.3 10*3/uL (ref 0.1–1.0)
Monocytes Relative: 7.5 % (ref 3.0–12.0)
Neutro Abs: 2.3 10*3/uL (ref 1.4–7.7)
Neutrophils Relative %: 50.7 % (ref 43.0–77.0)
PLATELETS: 234 10*3/uL (ref 150.0–400.0)
RBC: 4.38 Mil/uL (ref 3.87–5.11)
RDW: 13.7 % (ref 11.5–15.5)
WBC: 4.6 10*3/uL (ref 4.0–10.5)

## 2017-06-07 LAB — TSH: TSH: 1.39 u[IU]/mL (ref 0.35–4.50)

## 2017-06-07 NOTE — Progress Notes (Signed)
Alyssa [name] is a 32 y.o. female here to [facility].  I acted as a Education administrator for [employer], PA-C Anselmo Pickler, LPN  History of Present Illness:   Chief Complaint  Patient presents with  . Establish Care    [facility]  . Annual Exam    Fasting    Acute Concerns: ADD -- used to take Adderall in HS and College. Graduated with RN, has failed boards and is trying to study for her boards currently and is having issues with concentration.  She would like to resume PD medications as needed.  She has not had any formal testing recently. Neck nodule and [name] pain-- nodule has been present for around 6 months. Pressure with touch. Doesn't change in size or tenderness.  She denies any unusual headaches, or neck stiffness.  She has any prior trauma.  She denies any unusual vision changes or fevers.  Is not really tried anything other than massage for this.  She also reports that she is having some mid thoracic tenderness with palpation. Dysthymia - patient reports that she has been grieving the loss of her sister, who passed away 3 years ago.  She feels like she has not had closure with this.  She is tearful today.  She has never been diagnosed with depression or had issues with suicidal or homicidal thoughts.  Health Maintenance: Immunizations -- UTD Colonoscopy -- N/A Mammogram -- N/A PAP -- done 2018 Normal per pt, will obtain records. Bone Density -- N/A Diet -- when she is busy doesn't eat Caffeine intake -- limited intake of soda, coffee daily Sleep habits -- doesn't sleep well, has a 4 month old Exercise -- limited time with exercise due to time Weight -- Weight: 120 lb (54.4 kg) -- normal for her Mood -- "emotional"  Depression screen [facility] 2/9 06/07/2017  Decreased Interest 0  Down, Depressed, Hopeless 1  PHQ - 2 Score 1    No flowsheet data found.  Other providers/specialists: [facility]  Past Medical History:  Diagnosis Date  . GERD (gastroesophageal  reflux disease)   . History of chicken pox      Social History   Socioeconomic History  . Marital status: Married    Spouse name: Not on file  . Number of children: 3  . Years of education: Not on file  . Highest education level: Not on file  Occupational History  . Not on file  Social Needs  . Financial resource strain: Not on file  . Food insecurity:    Worry: Not on file    Inability: Not on file  . Transportation needs:    Medical: Not on file    Non-medical: Not on file  Tobacco Use  . Smoking status: Never Smoker  . Smokeless tobacco: Never Used  Substance and Sexual Activity  . Alcohol use: Yes    Frequency: Never    Comment: occassionally  . Drug use: No  . Sexual activity: Yes    Birth control/protection: Surgical    Comment: Tubal Ligation  Lifestyle  . Physical activity:    Days per week: Not on file    Minutes per session: Not on file  . Stress: Not on file  Relationships  . Social connections:    Talks on phone: Not on file    Gets together: Not on file    Attends religious service: Not on file    Active member of club or organization: Not on file  Attends meetings of clubs or organizations: Not on file    Relationship status: Not on file  . Intimate partner violence:    Fear of current or ex partner: Not on file    Emotionally abused: Not on file    Physically abused: Not on file    Forced sexual activity: Not on file  Other Topics Concern  . 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 CESAREAN SECTION  2017, 2018  . CESAREAN SECTION WITH BILATERAL TUBAL LIGATION N/A 12/29/2016   Procedure: REPEAT CESAREAN SECTION WITH BILATERAL TUBAL LIGATION;  Surgeon: Linda Hedges, DO;  Location: [facility];  Service: Obstetrics;  Laterality: N/A;  . PLACEMENT OF BREAST IMPLANTS    . TUBAL LIGATION  2018  . WISDOM TOOTH EXTRACTION   2006    Family History  Problem Relation Age of Onset  . Diabetes Mother   . High Cholesterol Father   . Colon cancer Neg Hx   . Breast cancer Neg Hx     No Known Allergies   Current Medications:   Current Outpatient Medications:  .  ibuprofen (ADVIL) 200 MG tablet, Take 400-600 mg by mouth as needed., Disp: , Rfl:    Review of Systems:   Review of Systems  Constitutional: Negative.  Negative for chills, fever, malaise/fatigue and weight loss.  HENT: Negative.  Negative for hearing loss, sinus pain and sore throat.   Eyes: Negative.  Negative for blurred vision.  Respiratory: Negative.  Negative for cough and shortness of breath.   Cardiovascular: Negative.  Negative for chest pain, palpitations and leg swelling.  Gastrointestinal: Negative.  Negative for abdominal pain, constipation, diarrhea, heartburn, nausea and vomiting.  Genitourinary: Negative.  Negative for dysuria, frequency and urgency.  Musculoskeletal: Positive for neck pain. Negative for [name] pain and myalgias.  Skin: Negative.  Negative for itching and rash.  Neurological: Negative.  Negative for dizziness, tingling, seizures, loss of consciousness and headaches.  Endo/Heme/Allergies: Negative.  Negative for polydipsia.  Psychiatric/Behavioral: Negative.  Negative for depression. The patient is not nervous/anxious.     Vitals:   Vitals:   06/07/17 0910  BP: 110/70  Pulse: 61  Temp: 98 F (36.7 C)  TempSrc: Oral  SpO2: 99%  Weight: 120 lb (54.4 kg)  Height: 5' 3.5" (1.613 m)     Body mass index is 20.92 kg/m.  Physical Exam:   Physical Exam  Constitutional: She appears well-developed and well-nourished. She is cooperative.  Non-toxic appearance. She does not have a sickly appearance. She does not appear ill. No distress.  HENT:  Head: Normocephalic and atraumatic.  Right Ear: Tympanic membrane, external ear and ear canal normal. Tympanic membrane is not erythematous, not retracted and not  bulging.  Left Ear: Tympanic membrane, external ear and ear canal normal. Tympanic membrane is not erythematous, not retracted and not bulging.  Eyes: Pupils are equal, round, and reactive to light. Conjunctivae, EOM and lids are normal.  Neck: Trachea normal and full passive range of motion without pain.  Cardiovascular: Normal rate, regular rhythm, S1 normal, S2 normal, normal heart sounds and intact distal pulses.  Pulmonary/Chest: Effort normal and breath sounds normal. No tachypnea. No respiratory distress. She has no decreased breath sounds. She has no wheezes. She has no rhonchi. She has no rales.  Abdominal: Soft. Normal appearance and bowel sounds are  normal. There is no tenderness.  Musculoskeletal: Normal range of motion.  Small nodule palpated at base of occipital area on right.  Tenderness with palpation.  Tenderness with palpation to mid thoracic spine.  No paraspinal muscle tenderness.  No decreased range of motion.  No ecchymosis or erythema noted.  Lymphadenopathy:    She has no cervical adenopathy.  Neurological: She is alert. She has normal strength and normal reflexes. No cranial nerve deficit or sensory deficit. Coordination and gait normal. GCS eye subscore is 4. GCS verbal subscore is 5. GCS motor subscore is 6.  Skin: Skin is warm, dry and intact.  Psychiatric: She has a normal mood and affect. Her speech is normal and behavior is normal.  Nursing note and vitals reviewed.   Assessment and Plan:    Jameila was seen today for establish care and annual exam.  Diagnoses and all orders for this visit:  Encounter for general adult medical examination with abnormal findings Today patient counseled on age appropriate routine health concerns for screening and prevention, each reviewed and up to date or declined. Immunizations reviewed and up to date or declined. Labs ordered and reviewed. Risk factors for depression reviewed and negative. Hearing function and visual acuity are  intact. ADLs screened and addressed as needed. Functional ability and level of safety reviewed and appropriate. Education, counseling and referrals performed based on assessed risks today. Patient provided with a copy of personalized plan for preventive services.  Encounter for lipid screening for cardiovascular disease -     Lipid panel  Screening for HIV (human immunodeficiency virus) -     HIV antibody  Neck nodule and Acute midline thoracic [name] pain I would like for patient to see Dr. Teresa Coombs here in our office for further evaluation and treatment to determine the best imaging needed as well as intervention.  Take ibuprofen or Tylenol as needed.  Attention deficit and Dysthymia I recommended patient undergo formal ADD evaluation in order to determine if medications are needed at this time.  She would like to speak with a therapist for counseling for a few issues.  I will put in a referral to Dr. Irven Shelling, with the hope that she can get in relatively soon for counseling if possible.  She declines medication at this time for her mood.  -     CBC with Differential/Platelet -     Comprehensive metabolic panel -     TSH  . Reviewed expectations re: course of current medical issues. . Discussed self-management of symptoms. . Outlined signs and symptoms indicating need for more acute intervention. . Patient verbalized understanding and all questions were answered. . See orders for this visit as documented in the electronic medical record. . Patient received an After-Visit Summary.   CMA or LPN served as scribe during this visit. History, Physical, and Plan performed by medical provider. Documentation and orders reviewed and attested to.   Inda Coke, PA-C

## 2017-06-07 NOTE — Patient Instructions (Signed)
It was great to meet you!  We will call you with your lab results.  Please make an appointment here with Dr. Paulla Fore for further evaluation and treatment of your neck nodule and back pain.  You will be contacted about your ADD referral.   Health Maintenance, Female Adopting a healthy lifestyle and getting preventive care can go a long way to promote health and wellness. Talk with your health care provider about what schedule of regular examinations is right for you. This is a good chance for you to check in with your provider about disease prevention and staying healthy. In between checkups, there are plenty of things you can do on your own. Experts have done a lot of research about which lifestyle changes and preventive measures are most likely to keep you healthy. Ask your health care provider for more information. Weight and diet Eat a healthy diet  Be sure to include plenty of vegetables, fruits, low-fat dairy products, and lean protein.  Do not eat a lot of foods high in solid fats, added sugars, or salt.  Get regular exercise. This is one of the most important things you can do for your health. ? Most adults should exercise for at least 150 minutes each week. The exercise should increase your heart rate and make you sweat (moderate-intensity exercise). ? Most adults should also do strengthening exercises at least twice a week. This is in addition to the moderate-intensity exercise.  Maintain a healthy weight  Body mass index (BMI) is a measurement that can be used to identify possible weight problems. It estimates body fat based on height and weight. Your health care provider can help determine your BMI and help you achieve or maintain a healthy weight.  For females 52 years of age and older: ? A BMI below 18.5 is considered underweight. ? A BMI of 18.5 to 24.9 is normal. ? A BMI of 25 to 29.9 is considered overweight. ? A BMI of 30 and above is considered obese.  Watch levels of  cholesterol and blood lipids  You should start having your blood tested for lipids and cholesterol at 31 years of age, then have this test every 5 years.  You may need to have your cholesterol levels checked more often if: ? Your lipid or cholesterol levels are high. ? You are older than 31 years of age. ? You are at high risk for heart disease.  Cancer screening Lung Cancer  Lung cancer screening is recommended for adults 81-13 years old who are at high risk for lung cancer because of a history of smoking.  A yearly low-dose CT scan of the lungs is recommended for people who: ? Currently smoke. ? Have quit within the past 15 years. ? Have at least a 30-pack-year history of smoking. A pack year is smoking an average of one pack of cigarettes a day for 1 year.  Yearly screening should continue until it has been 15 years since you quit.  Yearly screening should stop if you develop a health problem that would prevent you from having lung cancer treatment.  Breast Cancer  Practice breast self-awareness. This means understanding how your breasts normally appear and feel.  It also means doing regular breast self-exams. Let your health care provider know about any changes, no matter how small.  If you are in your 20s or 30s, you should have a clinical breast exam (CBE) by a health care provider every 1-3 years as part of a regular health exam.  If you are 40 or older, have a CBE every year. Also consider having a breast X-ray (mammogram) every year.  If you have a family history of breast cancer, talk to your health care provider about genetic screening.  If you are at high risk for breast cancer, talk to your health care provider about having an MRI and a mammogram every year.  Breast cancer gene (BRCA) assessment is recommended for women who have family members with BRCA-related cancers. BRCA-related cancers include: ? Breast. ? Ovarian. ? Tubal. ? Peritoneal cancers.  Results  of the assessment will determine the need for genetic counseling and BRCA1 and BRCA2 testing.  Cervical Cancer Your health care provider may recommend that you be screened regularly for cancer of the pelvic organs (ovaries, uterus, and vagina). This screening involves a pelvic examination, including checking for microscopic changes to the surface of your cervix (Pap test). You may be encouraged to have this screening done every 3 years, beginning at age 21.  For women ages 30-65, health care providers may recommend pelvic exams and Pap testing every 3 years, or they may recommend the Pap and pelvic exam, combined with testing for human papilloma virus (HPV), every 5 years. Some types of HPV increase your risk of cervical cancer. Testing for HPV may also be done on women of any age with unclear Pap test results.  Other health care providers may not recommend any screening for nonpregnant women who are considered low risk for pelvic cancer and who do not have symptoms. Ask your health care provider if a screening pelvic exam is right for you.  If you have had past treatment for cervical cancer or a condition that could lead to cancer, you need Pap tests and screening for cancer for at least 20 years after your treatment. If Pap tests have been discontinued, your risk factors (such as having a new sexual partner) need to be reassessed to determine if screening should resume. Some women have medical problems that increase the chance of getting cervical cancer. In these cases, your health care provider may recommend more frequent screening and Pap tests.  Colorectal Cancer  This type of cancer can be detected and often prevented.  Routine colorectal cancer screening usually begins at 31 years of age and continues through 31 years of age.  Your health care provider may recommend screening at an earlier age if you have risk factors for colon cancer.  Your health care provider may also recommend using  home test kits to check for hidden blood in the stool.  A small camera at the end of a tube can be used to examine your colon directly (sigmoidoscopy or colonoscopy). This is done to check for the earliest forms of colorectal cancer.  Routine screening usually begins at age 50.  Direct examination of the colon should be repeated every 5-10 years through 31 years of age. However, you may need to be screened more often if early forms of precancerous polyps or small growths are found.  Skin Cancer  Check your skin from head to toe regularly.  Tell your health care provider about any new moles or changes in moles, especially if there is a change in a mole's shape or color.  Also tell your health care provider if you have a mole that is larger than the size of a pencil eraser.  Always use sunscreen. Apply sunscreen liberally and repeatedly throughout the day.  Protect yourself by wearing long sleeves, pants, a wide-brimmed hat, and   sunglasses whenever you are outside.  Heart disease, diabetes, and high blood pressure  High blood pressure causes heart disease and increases the risk of stroke. High blood pressure is more likely to develop in: ? People who have blood pressure in the high end of the normal range (130-139/85-89 mm Hg). ? People who are overweight or obese. ? People who are African American.  If you are 44-32 years of age, have your blood pressure checked every 3-5 years. If you are 62 years of age or older, have your blood pressure checked every year. You should have your blood pressure measured twice-once when you are at a hospital or clinic, and once when you are not at a hospital or clinic. Record the average of the two measurements. To check your blood pressure when you are not at a hospital or clinic, you can use: ? An automated blood pressure machine at a pharmacy. ? A home blood pressure monitor.  If you are between 71 years and 6 years old, ask your health care provider  if you should take aspirin to prevent strokes.  Have regular diabetes screenings. This involves taking a blood sample to check your fasting blood sugar level. ? If you are at a normal weight and have a low risk for diabetes, have this test once every three years after 31 years of age. ? If you are overweight and have a high risk for diabetes, consider being tested at a younger age or more often. Preventing infection Hepatitis B  If you have a higher risk for hepatitis B, you should be screened for this virus. You are considered at high risk for hepatitis B if: ? You were born in a country where hepatitis B is common. Ask your health care provider which countries are considered high risk. ? Your parents were born in a high-risk country, and you have not been immunized against hepatitis B (hepatitis B vaccine). ? You have HIV or AIDS. ? You use needles to inject street drugs. ? You live with someone who has hepatitis B. ? You have had sex with someone who has hepatitis B. ? You get hemodialysis treatment. ? You take certain medicines for conditions, including cancer, organ transplantation, and autoimmune conditions.  Hepatitis C  Blood testing is recommended for: ? Everyone born from [birth year] through 1964. ? Anyone with known risk factors for hepatitis C.  Sexually transmitted infections (STIs)  You should be screened for sexually transmitted infections (STIs) including gonorrhea and chlamydia if: ? You are sexually active and are younger than 31 years of age. ? You are older than 31 years of age and your health care provider tells you that you are at risk for this type of infection. ? Your sexual activity has changed since you were last screened and you are at an increased risk for chlamydia or gonorrhea. Ask your health care provider if you are at risk.  If you do not have HIV, but are at risk, it may be recommended that you take a prescription medicine daily to prevent HIV infection. This  is called pre-exposure prophylaxis (PrEP). You are considered at risk if: ? You are sexually active and do not regularly use condoms or know the HIV status of your partner(s). ? You take drugs by injection. ? You are sexually active with a partner who has HIV.  Talk with your health care provider about whether you are at high risk of being infected with HIV. If you choose to begin PrEP, you  should first be tested for HIV. You should then be tested every 3 months for as long as you are taking PrEP. Pregnancy  If you are premenopausal and you may become pregnant, ask your health care provider about preconception counseling.  If you may become pregnant, take 400 to 800 micrograms (mcg) of folic acid every day.  If you want to prevent pregnancy, talk to your health care provider about birth control (contraception). Osteoporosis and menopause  Osteoporosis is a disease in which the bones lose minerals and strength with aging. This can result in serious bone fractures. Your risk for osteoporosis can be identified using a bone density scan.  If you are 73 years of age or older, or if you are at risk for osteoporosis and fractures, ask your health care provider if you should be screened.  Ask your health care provider whether you should take a calcium or vitamin D supplement to lower your risk for osteoporosis.  Menopause may have certain physical symptoms and risks.  Hormone replacement therapy may reduce some of these symptoms and risks. Talk to your health care provider about whether hormone replacement therapy is right for you. Follow these instructions at home:  Schedule regular health, dental, and eye exams.  Stay current with your immunizations.  Do not use any tobacco products including cigarettes, chewing tobacco, or electronic cigarettes.  If you are pregnant, do not drink alcohol.  If you are breastfeeding, limit how much and how often you drink alcohol.  Limit alcohol intake  to no more than 1 drink per day for nonpregnant women. One drink equals 12 ounces of beer, 5 ounces of wine, or 1 ounces of hard liquor.  Do not use street drugs.  Do not share needles.  Ask your health care provider for help if you need support or information about quitting drugs.  Tell your health care provider if you often feel depressed.  Tell your health care provider if you have ever been abused or do not feel safe at home. This information is not intended to replace advice given to you by your health care provider. Make sure you discuss any questions you have with your health care provider. Document Released: 07/24/2010 Document Revised: 06/16/2015 Document Reviewed: 10/12/2014 Elsevier Interactive Patient Education  [employer].

## 2017-06-08 LAB — HIV ANTIBODY (ROUTINE TESTING W REFLEX): HIV 1&2 Ab, 4th Generation: NONREACTIVE

## 2017-06-10 ENCOUNTER — Telehealth: Payer: Self-pay | Admitting: Physician Assistant

## 2017-06-10 NOTE — Telephone Encounter (Signed)
Copied from [place] [id number]. Topic: Quick Communication - Lab Results >> Jun 10, 2017 10:06 AM Marian Sorrow, LPN wrote: Called patient to inform them of 06/07/2017 lab results. When patient returns call, triage nurse may disclose results.   cb is [phone]

## 2017-06-10 NOTE — Progress Notes (Signed)
Please call patient and let them know that all of the labwork that we completed came back normal, including kidney, liver, cholesterol, blood sugar, infection count, HIV and thyroid levels.

## 2017-06-10 NOTE — Telephone Encounter (Signed)
Charted in result notes. 

## 2017-07-30 ENCOUNTER — Encounter: Payer: Self-pay | Admitting: Physician Assistant

## 2017-07-30 ENCOUNTER — Ambulatory Visit (INDEPENDENT_AMBULATORY_CARE_PROVIDER_SITE_OTHER): Payer: 59 | Admitting: Physician Assistant

## 2017-07-30 ENCOUNTER — Ambulatory Visit (INDEPENDENT_AMBULATORY_CARE_PROVIDER_SITE_OTHER): Payer: 59

## 2017-07-30 VITALS — BP 110/68 | HR 103 | Temp 98.1°F | Ht 63.5 in | Wt 118.2 lb

## 2017-07-30 DIAGNOSIS — R059 Cough, unspecified: Secondary | ICD-10-CM

## 2017-07-30 DIAGNOSIS — R05 Cough: Secondary | ICD-10-CM

## 2017-07-30 DIAGNOSIS — R0781 Pleurodynia: Secondary | ICD-10-CM | POA: Diagnosis not present

## 2017-07-30 MED ORDER — METHYLPREDNISOLONE ACETATE 40 MG/ML IJ SUSP
40.0000 mg | Freq: Once | INTRAMUSCULAR | Status: AC
Start: 2017-07-30 — End: 2017-07-30
  Administered 2017-07-30: 40 mg via INTRAMUSCULAR

## 2017-07-30 MED ORDER — AZITHROMYCIN 250 MG PO TABS: ORAL_TABLET | ORAL | 0 refills | Status: AC

## 2017-07-30 MED ORDER — BENZONATATE 100 MG PO CAPS: 100.0000 mg | ORAL_CAPSULE | Freq: Two times a day (BID) | ORAL | 0 refills | Status: AC | PRN

## 2017-07-30 NOTE — Patient Instructions (Signed)
It was great to see you!  You have a upper respiratory infection.   Use medication as prescribed: Azithromycin  Push fluids and get plenty of rest. Please return if you are not improving as expected, or if you have high fevers (>101.5) or difficulty swallowing or worsening productive cough.  Call clinic with questions.  I hope you start feeling better soon!

## 2017-07-30 NOTE — Progress Notes (Signed)
Alyssa [name] is a 32 y.o. female here for a new problem.  I acted as a Education administrator for [employer], PA-C Anselmo Pickler, LPN  History of Present Illness:   Chief Complaint  Patient presents with  . Cough    Cough  This is a new problem. Episode onset: Started a week ago. The problem has been waxing and waning. Cough characteristics: expectorating green sputum. Associated symptoms include a fever (101-102 last night and this AM), headaches, nasal congestion, postnasal drip, shortness of breath and wheezing (This AM ). Pertinent negatives include no sore throat. Associated symptoms comments:  Pain on left side of ribs with coughing.. The symptoms are aggravated by lying down. Treatments tried: Ibuprofen and Nyquil. The treatment provided no relief. There is no history of asthma, bronchitis, COPD, emphysema, environmental allergies or pneumonia.    Past Medical History:  Diagnosis Date  . GERD (gastroesophageal reflux disease)   . History of chicken pox      Social History   Socioeconomic History  . Marital status: Married    Spouse name: Not on file  . Number of children: 3  . Years of education: Not on file  . Highest education level: Not on file  Occupational History  . Not on file  Social Needs  . Financial resource strain: Not on file  . Food insecurity:    Worry: Not on file    Inability: Not on file  . Transportation needs:    Medical: Not on file    Non-medical: Not on file  Tobacco Use  . Smoking status: Never Smoker  . Smokeless tobacco: Never Used  Substance and Sexual Activity  . Alcohol use: Yes    Frequency: Never    Comment: occassionally  . Drug use: No  . Sexual activity: Yes    Birth control/protection: Surgical    Comment: Tubal Ligation  Lifestyle  . Physical activity:    Days per week: Not on file    Minutes per session: Not on file  . Stress: Not on file  Relationships  . Social connections:    Talks on phone: Not on file    Gets together:  Not on file    Attends religious service: Not on file    Active member of club or organization: Not on file    Attends meetings of clubs or organizations: Not on file    Relationship status: Not on file  . Intimate partner violence:    Fear of current or ex partner: Not on file    Emotionally abused: Not on file    Physically abused: Not on file    Forced sexual activity: Not on file  Other Topics Concern  . 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 CESAREAN SECTION  2017, 2018  . CESAREAN SECTION WITH BILATERAL TUBAL LIGATION N/A 12/29/2016   Procedure: REPEAT CESAREAN SECTION WITH BILATERAL TUBAL LIGATION;  Surgeon: Linda Hedges, DO;  Location: [facility];  Service: Obstetrics;  Laterality: N/A;  . PLACEMENT OF BREAST IMPLANTS    . TUBAL LIGATION  2018  . WISDOM TOOTH EXTRACTION  2006    Family History  Problem Relation Age of Onset  . Diabetes Mother   . High Cholesterol Father   . Colon cancer Neg Hx   . Breast cancer Neg Hx  No Known Allergies  Current Medications:   Current Outpatient Medications:  .  ibuprofen (ADVIL) 200 MG tablet, Take 400-600 mg by mouth as needed., Disp: , Rfl:  .  azithromycin (ZITHROMAX) 250 MG tablet, Take 2 tabs on day 1, then one tablet daily x 4 days, Disp: 6 tablet, Rfl: 0 .  benzonatate (TESSALON) 100 MG capsule, Take 1 capsule (100 mg total) by mouth 2 (two) times daily as needed for cough., Disp: 20 capsule, Rfl: 0   Review of Systems:   Review of Systems  Constitutional: Positive for fever (101-102 last night and this AM).  HENT: Positive for postnasal drip. Negative for sore throat.   Respiratory: Positive for cough, shortness of breath and wheezing (This AM ).   Neurological: Positive for headaches.  Endo/Heme/Allergies: Negative for environmental allergies.    Vitals:   Vitals:   07/30/17 1445   BP: 110/68  Pulse: (!) 103  Temp: 98.1 F (36.7 C)  TempSrc: Oral  SpO2: 97%  Weight: 118 lb 4 oz (53.6 kg)  Height: 5' 3.5" (1.613 m)     Body mass index is 20.62 kg/m.  Physical Exam:   Physical Exam  Constitutional: She appears well-developed. She is cooperative.  Non-toxic appearance. She does not have a sickly appearance. She does not appear ill. No distress.  HENT:  Head: Normocephalic and atraumatic.  Right Ear: Tympanic membrane, external ear and ear canal normal. Tympanic membrane is not erythematous, not retracted and not bulging.  Left Ear: Tympanic membrane, external ear and ear canal normal. Tympanic membrane is not erythematous, not retracted and not bulging.  Nose: Mucosal edema and rhinorrhea present. Right sinus exhibits no maxillary sinus tenderness and no frontal sinus tenderness. Left sinus exhibits no maxillary sinus tenderness and no frontal sinus tenderness.  Mouth/Throat: Uvula is midline and mucous membranes are normal. Posterior oropharyngeal erythema present. No posterior oropharyngeal edema. Tonsils are 0 on the right. Tonsils are 0 on the left. No tonsillar exudate.  Eyes: Conjunctivae and lids are normal.  Neck: Trachea normal.  Cardiovascular: Normal rate, regular rhythm, S1 normal, S2 normal and normal heart sounds.  Pulmonary/Chest: Effort normal and breath sounds normal. She has no decreased breath sounds. She has no wheezes. She has no rhonchi. She has no rales.  Dry cough throughout exam, lungs clear to auscultation bilaterally.  Lymphadenopathy:    She has no cervical adenopathy.  Neurological: She is alert.  Skin: Skin is warm, dry and intact.  Psychiatric: She has a normal mood and affect. Her speech is normal and behavior is normal.  Nursing note and vitals reviewed.  Chest xray: normal  Assessment and Plan:    Shatisha was seen today for cough.  Diagnoses and all orders for this visit:  Cough -     DG Chest 2 View; Future  Other  orders -     azithromycin (ZITHROMAX) 250 MG tablet; Take 2 tabs on day 1, then one tablet daily x 4 days -     benzonatate (TESSALON) 100 MG capsule; Take 1 capsule (100 mg total) by mouth 2 (two) times daily as needed for cough.   No red flags on exam.  Will initiate Azithromycin and Tessalon perles per orders. Discussed taking medications as prescribed. Reviewed return precautions including worsening fever, SOB, worsening cough or other concerns. Push fluids and rest. I recommend that patient follow-up if symptoms worsen or persist despite treatment x 7-10 days, sooner if needed.  . Reviewed expectations re: course of current  medical issues. . Discussed self-management of symptoms. . Outlined signs and symptoms indicating need for more acute intervention. . Patient verbalized understanding and all questions were answered. . See orders for this visit as documented in the electronic medical record. . Patient received an After-Visit Summary.  CMA or LPN served as scribe during this visit. History, Physical, and Plan performed by medical provider. Documentation and orders reviewed and attested to.   Inda Coke, PA-C

## 2017-07-31 ENCOUNTER — Encounter: Payer: Self-pay | Admitting: Physician Assistant

## 2017-10-22 DIAGNOSIS — B360 Pityriasis versicolor: Secondary | ICD-10-CM | POA: Diagnosis not present

## 2017-10-22 DIAGNOSIS — Z23 Encounter for immunization: Secondary | ICD-10-CM | POA: Diagnosis not present

## 2017-10-22 DIAGNOSIS — D224 Melanocytic nevi of scalp and neck: Secondary | ICD-10-CM | POA: Diagnosis not present

## 2017-10-22 DIAGNOSIS — D2239 Melanocytic nevi of other parts of face: Secondary | ICD-10-CM | POA: Diagnosis not present

## 2017-10-22 DIAGNOSIS — D2230 Melanocytic nevi of unspecified part of face: Secondary | ICD-10-CM | POA: Diagnosis not present

## 2017-10-22 DIAGNOSIS — B078 Other viral warts: Secondary | ICD-10-CM | POA: Diagnosis not present

## 2018-05-02 IMAGING — US US ABDOMEN COMPLETE
1 series · 14 of 25 positions shown · non-contrast
Comparison: None.

CLINICAL DATA: Epigastric abdominal pain and tenderness for the
past 4 days. Gastroesophageal reflux.

EXAM:
ABDOMEN ULTRASOUND COMPLETE

[Series 1: us abdomen complete · 0.15mm/px · 14 of 79 slices shown]
[im 1/79]
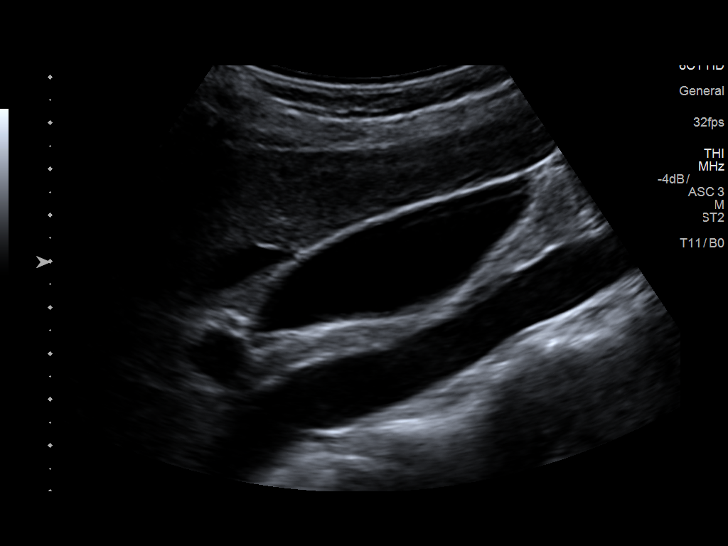
[im 7/79]
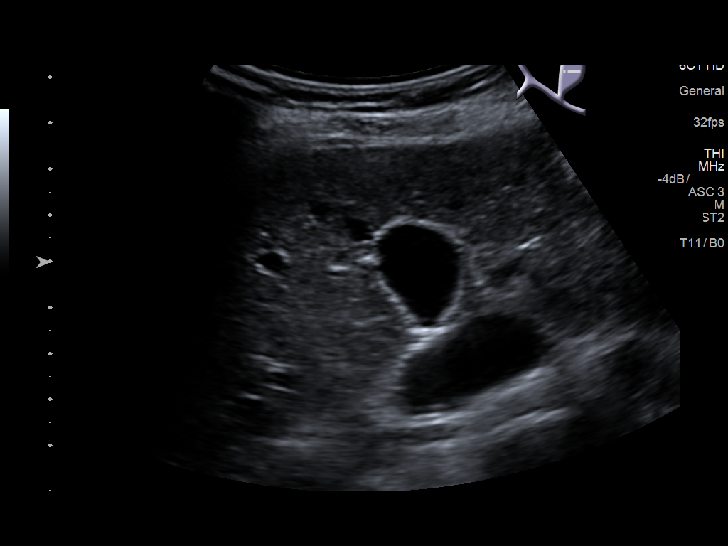
[im 14/79]
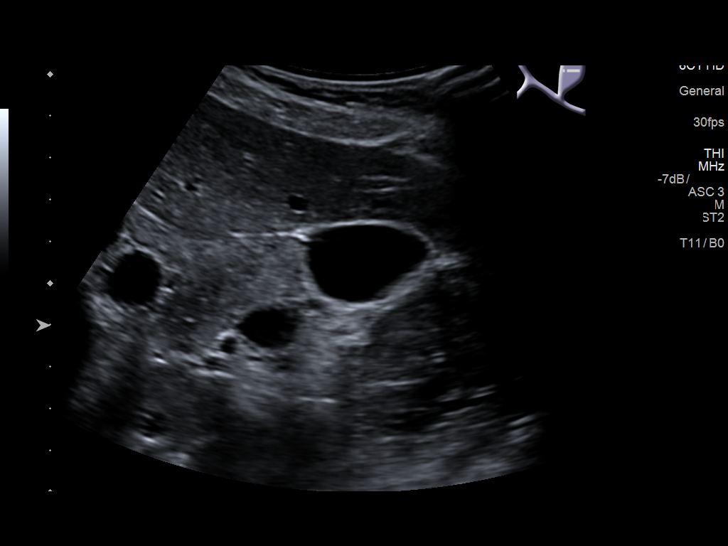
[im 20/79]
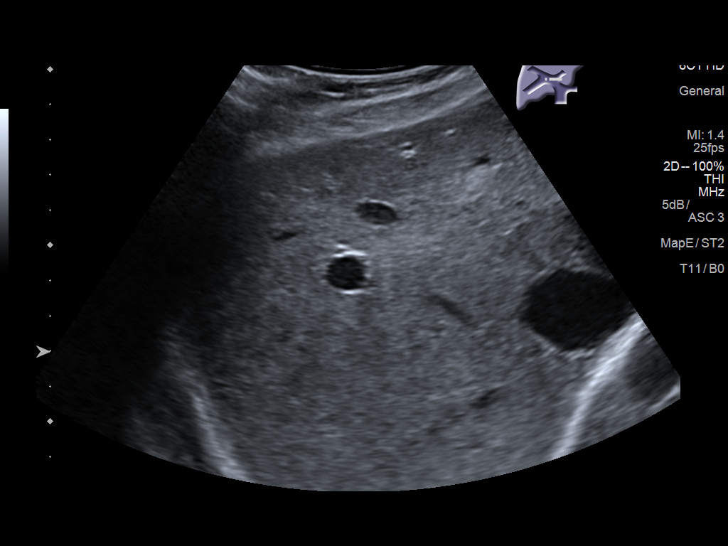
[im 27/79]
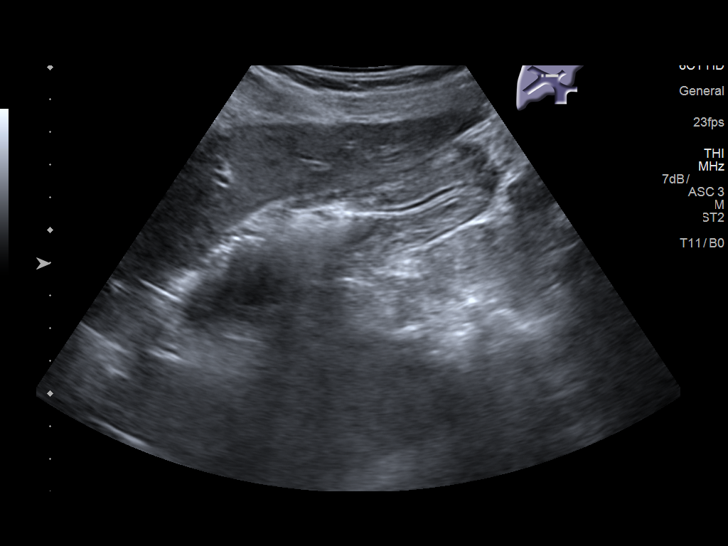
[im 30/79]
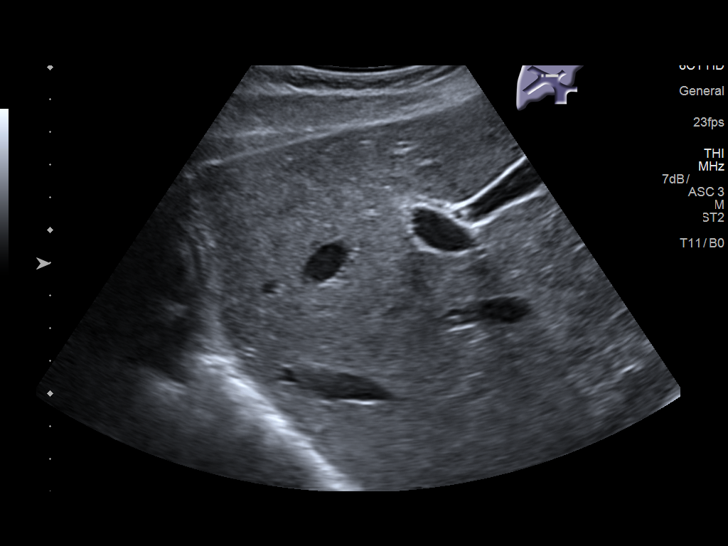
[im 36/79]
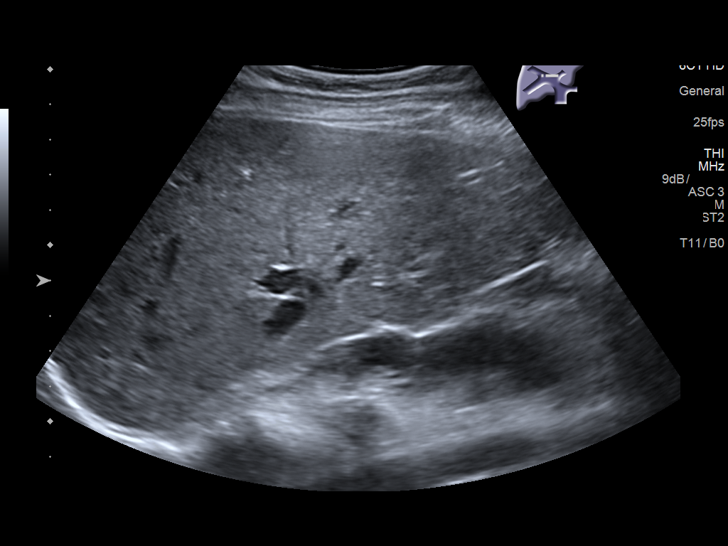
[im 43/79]
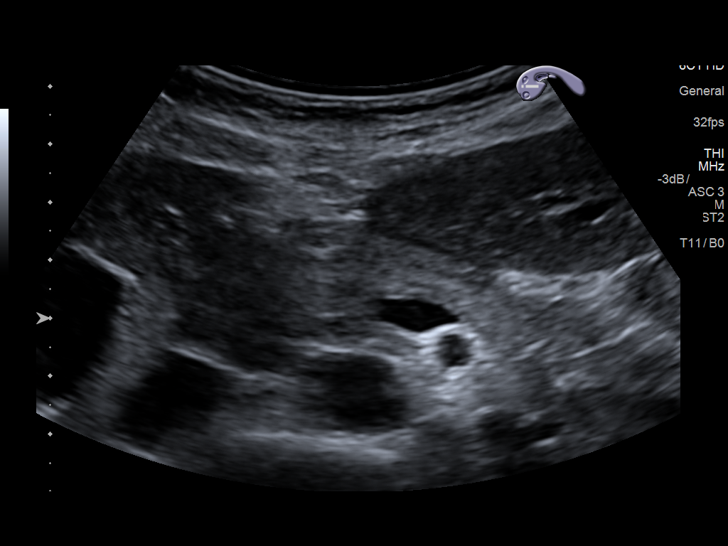
[im 49/79]
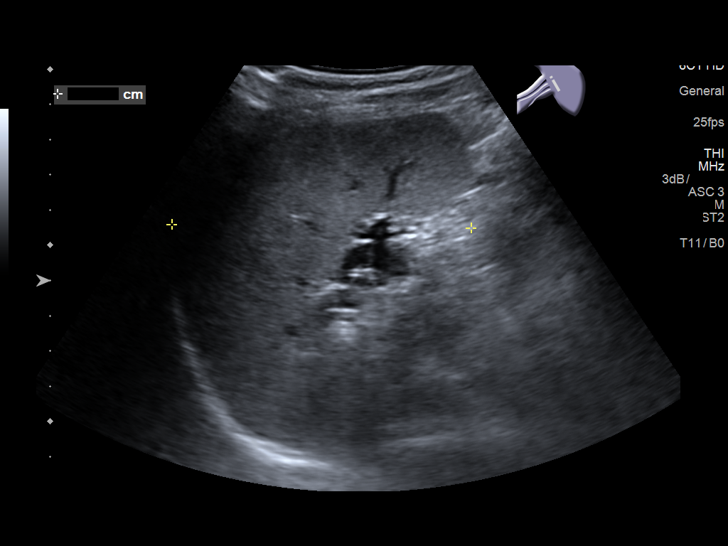
[im 53/79]
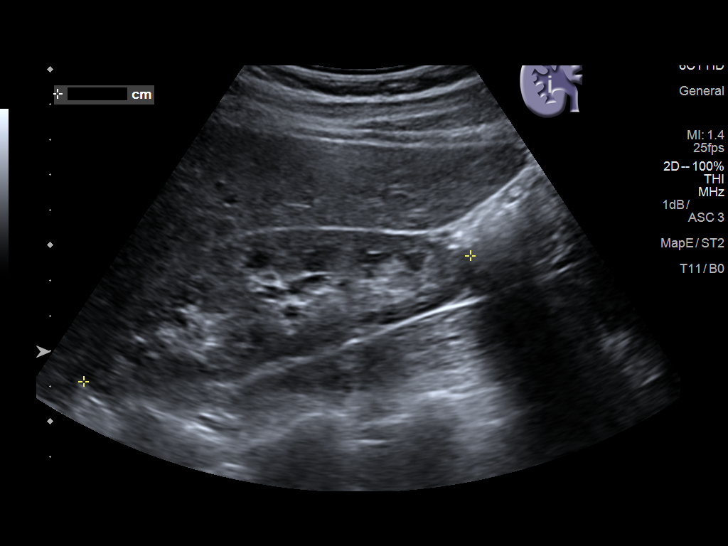
[im 59/79]
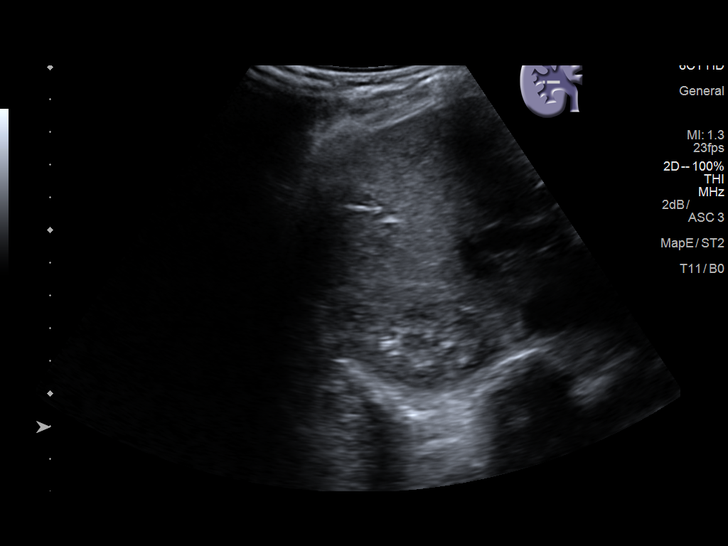
[im 66/79]
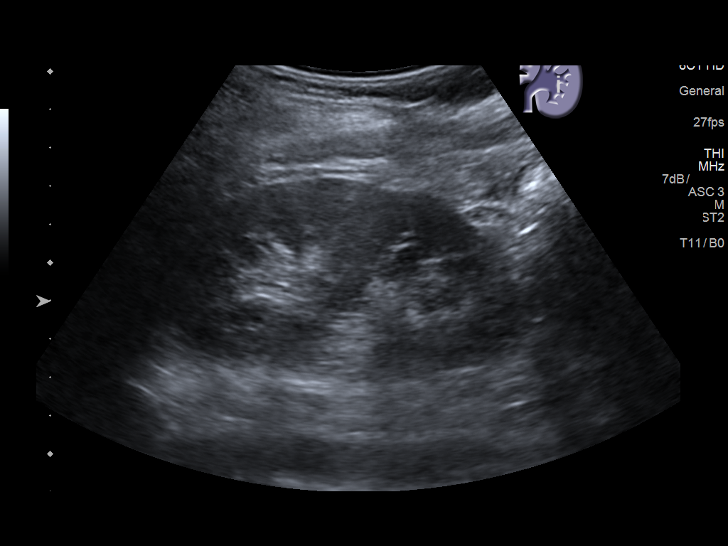
[im 72/79]
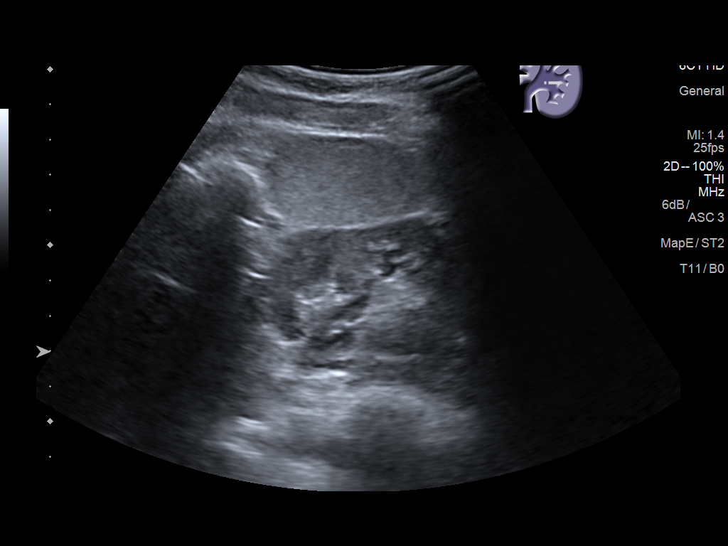
[im 79/79]
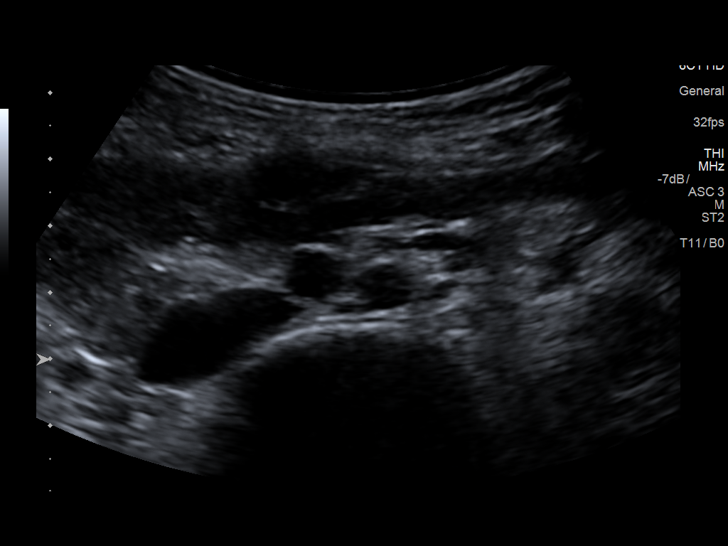

[14 of 25 positions shown; findings below may reference images not displayed]

FINDINGS: Gallbladder: No gallstones or wall thickening visualized. No
sonographic Murphy sign noted by sonographer.

Common bile duct: Diameter: 1.9 mm

Liver: No focal lesion identified. Within normal limits in
parenchymal echogenicity. Portal vein is patent on color Doppler
imaging with normal direction of blood flow towards the liver.

IVC: No abnormality visualized.

Pancreas: Visualized portion unremarkable.

Spleen: Size and appearance within normal limits.

Right Kidney: Length: 11.7 cm. Echogenicity within normal limits. No
mass or hydronephrosis visualized.

Left Kidney: Length: 10.0 cm. Echogenicity within normal limits. No
mass or hydronephrosis visualized.

Abdominal aorta: No aneurysm visualized.

Other findings: None.
IMPRESSION: Normal examination.

## 2018-09-29 IMAGING — DX DG CHEST 2V
2 series · 2 of 2 positions shown · non-contrast
Comparison: None.

CLINICAL DATA: Productive cough for 1 week.  Fever.  Left rib pain.

EXAM:
CHEST - 2 VIEW

[chest pa]
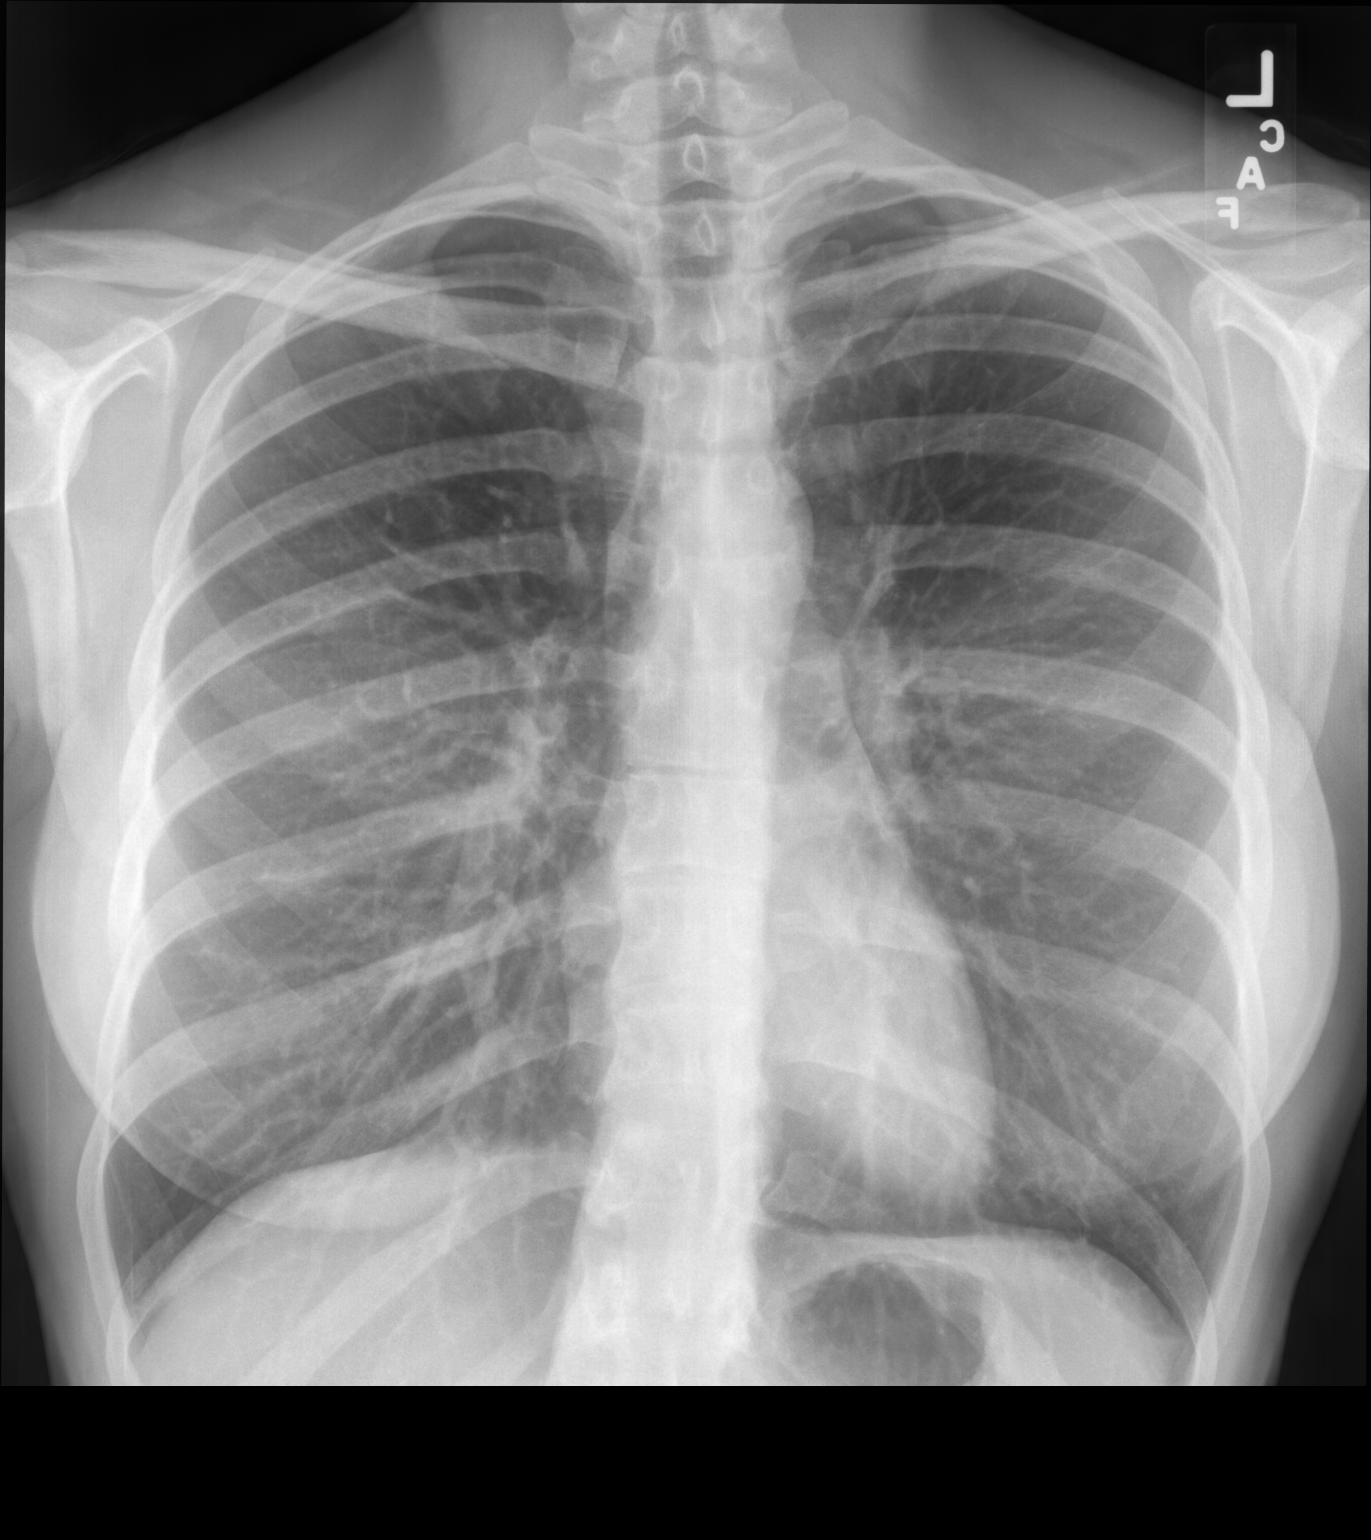

[chest lat]
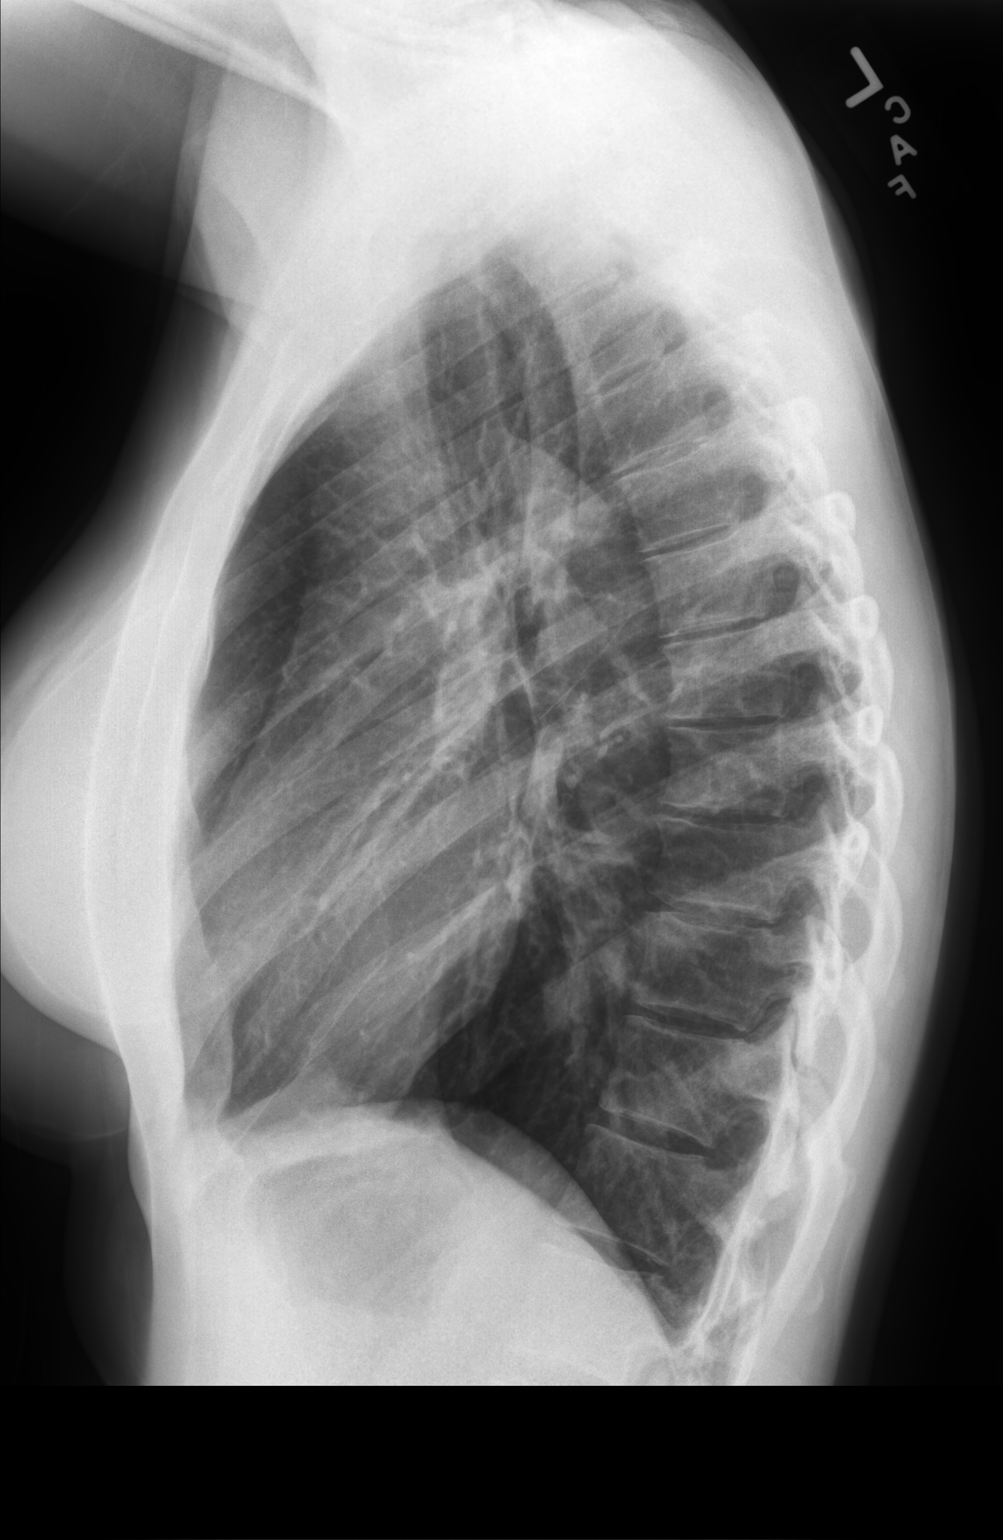

[2 of 2 positions shown; findings below may reference images not displayed]

FINDINGS: The cardiomediastinal contours are normal. The lungs are clear.
Pulmonary vasculature is normal. No consolidation, pleural effusion,
or pneumothorax. No acute osseous abnormalities are seen. Bilateral
breast implants noted.
IMPRESSION: Normal radiographs of the chest.

## 2021-10-16 ENCOUNTER — Encounter: Payer: Self-pay | Admitting: *Deleted

## 2022-01-30 ENCOUNTER — Other Ambulatory Visit: Payer: Self-pay | Admitting: Obstetrics and Gynecology

## 2022-01-30 DIAGNOSIS — N644 Mastodynia: Secondary | ICD-10-CM

## 2022-01-31 ENCOUNTER — Encounter: Payer: Self-pay | Admitting: Obstetrics and Gynecology

## 2022-01-31 DIAGNOSIS — N644 Mastodynia: Secondary | ICD-10-CM

## 2022-02-02 ENCOUNTER — Other Ambulatory Visit: Payer: Self-pay | Admitting: Obstetrics and Gynecology

## 2022-02-02 ENCOUNTER — Ambulatory Visit
Admission: RE | Admit: 2022-02-02 | Discharge: 2022-02-02 | Disposition: A | Discharge status: Home / Self Care | Payer: No Typology Code available for payment source | Source: Ambulatory Visit | Attending: Obstetrics and Gynecology | Admitting: Obstetrics and Gynecology

## 2022-02-02 ENCOUNTER — Ambulatory Visit
Admission: RE | Admit: 2022-02-02 | Discharge: 2022-02-02 | Disposition: A | Discharge status: Home / Self Care | Payer: Self-pay | Source: Ambulatory Visit | Attending: Obstetrics and Gynecology | Admitting: Obstetrics and Gynecology

## 2022-02-02 DIAGNOSIS — N644 Mastodynia: Secondary | ICD-10-CM

## 2022-02-02 DIAGNOSIS — N63 Unspecified lump in unspecified breast: Secondary | ICD-10-CM

## 2022-02-06 ENCOUNTER — Other Ambulatory Visit (HOSPITAL_COMMUNITY): Payer: Self-pay | Admitting: Diagnostic Radiology

## 2022-02-06 ENCOUNTER — Ambulatory Visit
Admission: RE | Admit: 2022-02-06 | Discharge: 2022-02-06 | Disposition: A | Discharge status: Home / Self Care | Payer: No Typology Code available for payment source | Source: Ambulatory Visit | Attending: Obstetrics and Gynecology | Admitting: Obstetrics and Gynecology

## 2022-02-06 ENCOUNTER — Other Ambulatory Visit (HOSPITAL_COMMUNITY)
Admission: RE | Admit: 2022-02-06 | Discharge: 2022-02-06 | Disposition: A | Discharge status: Home / Self Care | Payer: No Typology Code available for payment source | Source: Ambulatory Visit | Attending: Obstetrics and Gynecology | Admitting: Obstetrics and Gynecology

## 2022-02-06 DIAGNOSIS — N63 Unspecified lump in unspecified breast: Secondary | ICD-10-CM | POA: Insufficient documentation

## 2022-02-06 DIAGNOSIS — N644 Mastodynia: Secondary | ICD-10-CM

## 2022-02-07 ENCOUNTER — Other Ambulatory Visit: Payer: Self-pay

## 2022-02-09 LAB — CYTOLOGY - NON PAP

## 2022-02-11 LAB — AEROBIC/ANAEROBIC CULTURE W GRAM STAIN (SURGICAL/DEEP WOUND)
Culture: NO GROWTH
Culture: NO GROWTH
Gram Stain: NONE SEEN

## 2023-09-30 ENCOUNTER — Other Ambulatory Visit: Payer: Self-pay

## 2023-09-30 ENCOUNTER — Encounter (HOSPITAL_BASED_OUTPATIENT_CLINIC_OR_DEPARTMENT_OTHER): Payer: Self-pay

## 2023-09-30 DIAGNOSIS — R1030 Lower abdominal pain, unspecified: Secondary | ICD-10-CM | POA: Diagnosis present

## 2023-09-30 DIAGNOSIS — Z5321 Procedure and treatment not carried out due to patient leaving prior to being seen by health care provider: Secondary | ICD-10-CM | POA: Insufficient documentation

## 2023-09-30 LAB — CBC
HCT: 37.8 % (ref 36.0–46.0)
Hemoglobin: 12.9 g/dL (ref 12.0–15.0)
MCH: 31.4 pg (ref 26.0–34.0)
MCHC: 34.1 g/dL (ref 30.0–36.0)
MCV: 92 fL (ref 80.0–100.0)
Platelets: 249 K/uL (ref 150–400)
RBC: 4.11 MIL/uL (ref 3.87–5.11)
RDW: 11.9 % (ref 11.5–15.5)
WBC: 6.4 K/uL (ref 4.0–10.5)
nRBC: 0 % (ref 0.0–0.2)

## 2023-09-30 LAB — COMPREHENSIVE METABOLIC PANEL WITH GFR
ALT: 137 U/L — ABNORMAL HIGH (ref 0–44)
AST: 280 U/L — ABNORMAL HIGH (ref 15–41)
Albumin: 4.5 g/dL (ref 3.5–5.0)
Alkaline Phosphatase: 76 U/L (ref 38–126)
Anion gap: 13 (ref 5–15)
BUN: 14 mg/dL (ref 6–20)
CO2: 23 mmol/L (ref 22–32)
Calcium: 9.7 mg/dL (ref 8.9–10.3)
Chloride: 102 mmol/L (ref 98–111)
Creatinine, Ser: 0.71 mg/dL (ref 0.44–1.00)
GFR, Estimated: >60 mL/min (ref >60)
Glucose, Bld: 123 mg/dL — ABNORMAL HIGH (ref 70–99)
Potassium: 6 mmol/L — ABNORMAL HIGH (ref 3.5–5.1)
Sodium: 138 mmol/L (ref 135–145)
Total Bilirubin: 0.7 mg/dL (ref 0.0–1.2)
Total Protein: 7.3 g/dL (ref 6.5–8.1)

## 2023-09-30 LAB — URINALYSIS, ROUTINE W REFLEX MICROSCOPIC
Bilirubin Urine: NEGATIVE
Glucose, UA: NEGATIVE mg/dL
Hgb urine dipstick: NEGATIVE
Ketones, ur: 15 mg/dL — AB
Leukocytes,Ua: NEGATIVE
Nitrite: NEGATIVE
Protein, ur: NEGATIVE mg/dL
Specific Gravity, Urine: 1.02 (ref 1.005–1.030)
pH: 6.5 (ref 5.0–8.0)

## 2023-09-30 LAB — PREGNANCY, URINE: Preg Test, Ur: NEGATIVE

## 2023-09-30 LAB — LIPASE, BLOOD: Lipase: 33 U/L (ref 11–51)

## 2023-09-30 NOTE — ED Triage Notes (Signed)
 Pt presents via POV c/o lower abd/groin pain. Reports feels like abd/groin area is swollen compared to normal.

## 2023-10-01 ENCOUNTER — Emergency Department (HOSPITAL_BASED_OUTPATIENT_CLINIC_OR_DEPARTMENT_OTHER)
Admission: EM | Admit: 2023-10-01 | Discharge: 2023-10-01 | Discharge status: Against Medical Advice | Attending: Emergency Medicine | Admitting: Emergency Medicine
